# Patient Record
Sex: Female | Born: 1980 | Race: Black or African American | Hispanic: No | Smoking: Never smoker
Health system: Southern US, Community
[De-identification: ages and names within clinical notes are randomized; demographics above are authoritative.]

## PROBLEM LIST (undated history)

## (undated) DIAGNOSIS — D219 Benign neoplasm of connective and other soft tissue, unspecified: Secondary | ICD-10-CM

## (undated) HISTORY — DX: Benign neoplasm of connective and other soft tissue, unspecified: D21.9

---

## 2014-05-04 ENCOUNTER — Emergency Department: Payer: Self-pay | Admitting: Emergency Medicine

## 2015-07-05 ENCOUNTER — Emergency Department: Payer: Medicaid Other

## 2015-07-05 ENCOUNTER — Emergency Department
Admission: EM | Admit: 2015-07-05 | Discharge: 2015-07-05 | Disposition: A | Discharge status: Home / Self Care | Payer: Self-pay | Attending: Emergency Medicine | Admitting: Emergency Medicine

## 2015-07-05 ENCOUNTER — Encounter: Payer: Self-pay | Admitting: Emergency Medicine

## 2015-07-05 DIAGNOSIS — Z3202 Encounter for pregnancy test, result negative: Secondary | ICD-10-CM | POA: Insufficient documentation

## 2015-07-05 DIAGNOSIS — R51 Headache: Secondary | ICD-10-CM

## 2015-07-05 DIAGNOSIS — J0100 Acute maxillary sinusitis, unspecified: Secondary | ICD-10-CM | POA: Insufficient documentation

## 2015-07-05 DIAGNOSIS — R112 Nausea with vomiting, unspecified: Secondary | ICD-10-CM | POA: Insufficient documentation

## 2015-07-05 DIAGNOSIS — R519 Headache, unspecified: Secondary | ICD-10-CM

## 2015-07-05 LAB — POCT PREGNANCY, URINE: PREG TEST UR: NEGATIVE

## 2015-07-05 MED ORDER — KETOROLAC TROMETHAMINE 10 MG PO TABS
10.0000 mg | ORAL_TABLET | Freq: Once | ORAL | Status: DC
  Filled 2015-07-05: qty 1

## 2015-07-05 MED ORDER — ONDANSETRON HCL 4 MG/2ML IJ SOLN
4.0000 mg | Freq: Once | INTRAMUSCULAR | Status: AC
  Administered 2015-07-05: 4 mg via INTRAVENOUS
  Filled 2015-07-05: qty 2

## 2015-07-05 MED ORDER — ONDANSETRON 4 MG PO TBDP
4.0000 mg | ORAL_TABLET | Freq: Once | ORAL | Status: AC | PRN
  Administered 2015-07-05: 4 mg via ORAL

## 2015-07-05 MED ORDER — KETOROLAC TROMETHAMINE 30 MG/ML IJ SOLN
30.0000 mg | Freq: Once | INTRAMUSCULAR | Status: AC
  Administered 2015-07-05: 30 mg via INTRAVENOUS
  Filled 2015-07-05: qty 1

## 2015-07-05 MED ORDER — ONDANSETRON 4 MG PO TBDP
ORAL_TABLET | ORAL | Status: AC
  Filled 2015-07-05: qty 1

## 2015-07-05 MED ORDER — AMOXICILLIN-POT CLAVULANATE 875-125 MG PO TABS: 1.0000 | ORAL_TABLET | Freq: Two times a day (BID) | ORAL | Status: AC

## 2015-07-05 MED ORDER — KETOROLAC TROMETHAMINE 10 MG PO TABS: 10.0000 mg | ORAL_TABLET | Freq: Three times a day (TID) | ORAL | Status: DC | PRN

## 2015-07-05 MED ORDER — KETOROLAC TROMETHAMINE 10 MG PO TABS
ORAL_TABLET | ORAL | Status: AC
  Filled 2015-07-05: qty 1

## 2015-07-05 MED ORDER — DIPHENHYDRAMINE HCL 50 MG/ML IJ SOLN
25.0000 mg | Freq: Once | INTRAMUSCULAR | Status: AC
  Administered 2015-07-05: 25 mg via INTRAVENOUS
  Filled 2015-07-05: qty 1

## 2015-07-05 MED ORDER — ONDANSETRON 4 MG PO TBDP: 4.0000 mg | ORAL_TABLET | Freq: Three times a day (TID) | ORAL | Status: DC | PRN

## 2015-07-05 NOTE — ED Notes (Signed)
Pt states severe headache for the past few days, states hx of migranes when she was younger, states nausea, vomiting, light sensitivity, states she had cold and congestion last month but no trauma or problems since, pt awake and alert

## 2015-07-05 NOTE — ED Provider Notes (Signed)
[facility] Emergency Department Provider Note  ____________________________________________  Time seen: 4:00 PM  I have reviewed the triage vital signs and the nursing notes.   HISTORY  Chief Complaint Headache and Vomiting     HPI Sherry Sanders is a 34 y.o. female presents with generalized headache with onset yesterday afternoon persistently since then. Patient missed a photophobia and nausea and vomiting. Patient states that pain is aggravated with noise and light. Of note the patient admits to a history of migraine headaches in the past was recent of which was last week. In addition patient admits to 3 week history of nasal congestion. Patient admits to nausea however no vomiting.     Past medical history Headaches There are no active problems to display for this patient.   Past Surgical History  Procedure Laterality Date  . Cesarean section  2007    Current Outpatient Rx  Name  Route  Sig  Dispense  Refill  . amoxicillin-clavulanate (AUGMENTIN) 875-125 MG tablet   Oral   Take 1 tablet by mouth 2 (two) times daily.   14 tablet   0   . ketorolac (TORADOL) 10 MG tablet   Oral   Take 1 tablet (10 mg total) by mouth every 8 (eight) hours as needed.   20 tablet   0   . ondansetron (ZOFRAN ODT) 4 MG disintegrating tablet   Oral   Take 1 tablet (4 mg total) by mouth every 8 (eight) hours as needed for nausea or vomiting.   20 tablet   0     Allergies Review of patient's allergies indicates no known allergies.  No family history on file.  Social History Social History  Substance Use Topics  . Smoking status: Never Smoker   . Smokeless tobacco: None  . Alcohol Use: No    Review of Systems  Constitutional: Negative for fever. Eyes: Negative for visual changes. ENT: Negative for sore throat. Cardiovascular: Negative for chest pain. Respiratory: Negative for shortness of breath. Gastrointestinal: Negative for abdominal  pain, vomiting and diarrhea. Genitourinary: Negative for dysuria. Musculoskeletal: Negative for back pain. Skin: Negative for rash. Neurological: Positive for headache  10-point ROS otherwise negative.  ____________________________________________   PHYSICAL EXAM:  VITAL SIGNS: ED Triage Vitals  Enc Vitals Group     BP 07/05/15 1454 119/58 mmHg     Pulse Rate 07/05/15 1454 61     Resp 07/05/15 1454 16     Temp 07/05/15 1454 98.2 F (36.8 C)     Temp Source 07/05/15 1454 Oral     SpO2 07/05/15 1454 100 %     Weight 07/05/15 1454 161 lb (73.029 kg)     Height 07/05/15 1454 4\' 10"  (1.473 m)     Head Cir --      Peak Flow --      Pain Score 07/05/15 1455 10     Pain Loc --      Pain Edu? --      Excl. in [place]? --     Constitutional: Alert and oriented. Apparent discomfort Eyes: Conjunctivae are normal. PERRL. Normal extraocular movements. ENT   Head: Normocephalic and atraumatic.   Nose: No congestion/rhinnorhea. Pain with percussion of maxillary and frontal sinuses.   Mouth/Throat: Mucous membranes are moist.   Neck: No stridor. Hematological/Lymphatic/Immunilogical: No cervical lymphadenopathy. Cardiovascular: Normal rate, regular rhythm. Normal and symmetric distal pulses are present in all extremities. No murmurs, rubs, or gallops. Respiratory: Normal respiratory effort without tachypnea nor retractions.  Breath sounds are clear and equal bilaterally. No wheezes/rales/rhonchi. Gastrointestinal: Soft and nontender. No distention. There is no CVA tenderness. Genitourinary: deferred Musculoskeletal: Nontender with normal range of motion in all extremities. No joint effusions.  No lower extremity tenderness nor edema. Neurologic:  Normal speech and language. No gross focal neurologic deficits are appreciated. Speech is normal.  Skin:  Skin is warm, dry and intact. No rash noted. Psychiatric: Mood and affect are normal. Speech and behavior are normal. Patient  exhibits appropriate insight and judgment.  ____________________________________________    LABS (pertinent positives/negatives)  Labs Reviewed  POCT PREGNANCY, URINE     RADIOLOGY        CT Head Wo Contrast (Final result) Result time: 07/05/15 18:14:33   Final result by Rad Results In Interface (07/05/15 18:14:33)   Narrative:   CLINICAL DATA: Headache and vomiting  EXAM: CT HEAD WITHOUT CONTRAST  TECHNIQUE: Contiguous axial images were obtained from the base of the skull through the vertex without intravenous contrast.  COMPARISON: None.  FINDINGS: Ventricle size is normal. Negative for acute or chronic infarction. Negative for hemorrhage or fluid collection. Negative for mass or edema. No shift of the midline structures.  Calvarium is intact.  IMPRESSION: Normal   Electronically Signed By: Franchot Gallo M.D. On: 07/05/2015 18:14        INITIAL IMPRESSION / ASSESSMENT AND PLAN / ED COURSE  Pertinent labs & imaging results that were available during my care of the patient were reviewed by me and considered in my medical decision making (see chart for details).  Patient received IV Toradol Benadryl and Zofran with resolution of pain  ____________________________________________   FINAL CLINICAL IMPRESSION(S) / ED DIAGNOSES  Final diagnoses:  Acute nonintractable headache, unspecified headache type  Acute maxillary sinusitis, recurrence not specified      Gregor Hams, MD 07/05/15 [phone]

## 2015-07-05 NOTE — ED Notes (Signed)
Patient presents to the ED with headache that started yesterday afternoon.  Patient reports photophobia, nausea, and vomiting.  Patient reports pain to the sides of her head and the top of her head.  Patient reports smells bothering her.  Patient states this is one of the worst headaches she has ever had.  Patient is alert and oriented x 4.  Denies any head trauma.

## 2015-07-05 NOTE — Discharge Instructions (Signed)

## 2016-02-03 DIAGNOSIS — J301 Allergic rhinitis due to pollen: Secondary | ICD-10-CM | POA: Insufficient documentation

## 2017-07-05 ENCOUNTER — Other Ambulatory Visit: Payer: Self-pay

## 2017-07-05 ENCOUNTER — Emergency Department: Payer: Self-pay

## 2017-07-05 ENCOUNTER — Encounter: Payer: Self-pay | Admitting: Emergency Medicine

## 2017-07-05 ENCOUNTER — Emergency Department
Admission: EM | Admit: 2017-07-05 | Discharge: 2017-07-05 | Disposition: A | Discharge status: Home / Self Care | Payer: Self-pay | Attending: Emergency Medicine | Admitting: Emergency Medicine

## 2017-07-05 DIAGNOSIS — R079 Chest pain, unspecified: Secondary | ICD-10-CM | POA: Insufficient documentation

## 2017-07-05 LAB — TROPONIN I
Troponin I: <0.03 ng/mL (ref <0.03)
Troponin I: <0.03 ng/mL (ref <0.03)

## 2017-07-05 LAB — CBC
HEMATOCRIT: 40.8 % (ref 35.0–47.0)
HEMOGLOBIN: 13.9 g/dL (ref 12.0–16.0)
MCH: 30.9 pg (ref 26.0–34.0)
MCHC: 34.2 g/dL (ref 32.0–36.0)
MCV: 90.5 fL (ref 80.0–100.0)
Platelets: 253 10*3/uL (ref 150–440)
RBC: 4.51 MIL/uL (ref 3.80–5.20)
RDW: 13.4 % (ref 11.5–14.5)
WBC: 9 10*3/uL (ref 3.6–11.0)

## 2017-07-05 LAB — BASIC METABOLIC PANEL
ANION GAP: 5 (ref 5–15)
BUN: 13 mg/dL (ref 6–20)
CHLORIDE: 106 mmol/L (ref 101–111)
CO2: 23 mmol/L (ref 22–32)
Calcium: 8.6 mg/dL — ABNORMAL LOW (ref 8.9–10.3)
Creatinine, Ser: 0.85 mg/dL (ref 0.44–1.00)
GFR calc Af Amer: >60 mL/min (ref >60)
GLUCOSE: 87 mg/dL (ref 65–99)
POTASSIUM: 3.7 mmol/L (ref 3.5–5.1)
Sodium: 134 mmol/L — ABNORMAL LOW (ref 135–145)

## 2017-07-05 NOTE — ED Notes (Signed)
Pt to ed with c/o chest pain that started while she was walking up steps.  Pt states she felt weak, dizzy, and nearly passed out. Pt states she continues to feel weak, but denies chest pain at this time.  States chest pain lasted about 3 minutes and then resolved.  Pt with family at bedside. In no acute resp distress.  Pt alert and oriented, skin warm and dry.

## 2017-07-05 NOTE — ED Provider Notes (Signed)
[facility] Emergency Department Provider Note   ____________________________________________    I have reviewed the triage vital signs and the nursing notes.   HISTORY  Chief Complaint Chest Pain     HPI Sherry Sanders is a 36 y.o. female who presents with complaints of chest pain.  Patient reports she was walking up the stairs at approximately 11 AM this morning when she developed a tightness in her chest and she became quite lightheaded.  The pain did not radiate anywhere.  She has never had this before.  She reports she did not eat any breakfast this morning.  She reports EMS came and gave her aspirin and nitroglycerin at which point the tightness improved.  Currently she feels well and has no complaints.  No history of cardiac disease.  She does not smoke.  No history of high blood pressure.  No drug use.  No recent travel.  No calf pain or pleurisy.   History reviewed. No pertinent past medical history.  There are no active problems to display for this patient.   Past Surgical History:  Procedure Laterality Date  . CESAREAN SECTION  2007    Prior to Admission medications   Medication Sig Start Date End Date Taking? Authorizing Provider  ketorolac (TORADOL) 10 MG tablet Take 1 tablet (10 mg total) by mouth every 8 (eight) hours as needed. 07/05/15   Gregor Hams, MD  ondansetron (ZOFRAN ODT) 4 MG disintegrating tablet Take 1 tablet (4 mg total) by mouth every 8 (eight) hours as needed for nausea or vomiting. 07/05/15   Gregor Hams, MD     Allergies Patient has no known allergies.  No family history on file.  Social History Social History   Tobacco Use  . Smoking status: Never Smoker  Substance Use Topics  . Alcohol use: No  . Drug use: No    Review of Systems  Constitutional: No fever/chills Eyes: No visual changes.  ENT: No neck pain Cardiovascular: As above Respiratory: Denies shortness of breath.  No  cough Gastrointestinal: No abdominal pain.  No nausea, no vomiting.   Genitourinary: Negative for dysuria. Musculoskeletal: Negative for back pain. Skin: Negative for rash. Neurological: Negative for headaches   ____________________________________________   PHYSICAL EXAM:  VITAL SIGNS: ED Triage Vitals  Enc Vitals Group     BP 07/05/17 1229 97/67     Pulse Rate 07/05/17 1229 73     Resp 07/05/17 1229 16     Temp 07/05/17 1229 98.3 F (36.8 C)     Temp Source 07/05/17 1229 Oral     SpO2 07/05/17 1229 99 %     Weight 07/05/17 1230 73 kg (161 lb)     Height 07/05/17 1230 1.473 m (4\' 10" )     Head Circumference --      Peak Flow --      Pain Score 07/05/17 1229 5     Pain Loc --      Pain Edu? --      Excl. in [place]? --     Constitutional: Alert and oriented. No acute distress. Pleasant and interactive  Nose: No congestion/rhinnorhea. Mouth/Throat: Mucous membranes are moist.    Cardiovascular: Normal rate, regular rhythm. Grossly normal heart sounds.  Good peripheral circulation. Respiratory: Normal respiratory effort.  No retractions. Lungs CTAB. Gastrointestinal: Soft and nontender. No distention.  No CVA tenderness. Genitourinary: deferred Musculoskeletal: No lower extremity tenderness nor edema.  Warm and well perfused Neurologic:  Normal  speech and language. No gross focal neurologic deficits are appreciated.  Skin:  Skin is warm, dry and intact. No rash noted. Psychiatric: Mood and affect are normal. Speech and behavior are normal.  ____________________________________________   LABS (all labs ordered are listed, but only abnormal results are displayed)  Labs Reviewed  BASIC METABOLIC PANEL - Abnormal; Notable for the following components:      Result Value   Sodium 134 (*)    Calcium 8.6 (*)    All other components within normal limits  CBC  TROPONIN I  TROPONIN I   ____________________________________________  EKG  ED ECG REPORT I, Lavonia Drafts,  the attending physician, personally viewed and interpreted this ECG.  Date: 07/05/2017  Rhythm: normal sinus rhythm QRS Axis: normal Intervals: normal ST/T Wave abnormalities: normal Narrative Interpretation: no evidence of acute ischemia  ____________________________________________  RADIOLOGY  Chest x-ray normal ____________________________________________   PROCEDURES  Procedure(s) performed: No  Procedures   Critical Care performed:No ____________________________________________   INITIAL IMPRESSION / ASSESSMENT AND PLAN / ED COURSE  Pertinent labs & imaging results that were available during my care of the patient were reviewed by me and considered in my medical decision making (see chart for details).  She well-appearing in no acute distress.  Vitals are unremarkable.  EKG is reassuring.  Differential diagnosis includes vasovagal syncope, near syncope, ACS, arrhythmia, PE  Initial lab work and exam is quite reassuring.  However we will check second troponin to determine disposition given no risk factors for cardiac disease anticipate discharge  Second troponin normal.  Patient remains chest pain-free.  Vitals remain normal.  Reinforced need for outpatient follow-up with cardiology, patient and family agree.  No exercise or exertion until cleared by cardiology    ____________________________________________   FINAL CLINICAL IMPRESSION(S) / ED DIAGNOSES  Final diagnoses:  Chest pain, unspecified type        Note:  This document was prepared using Dragon voice recognition software and may include unintentional dictation errors.    Lavonia Drafts, MD 07/05/17 [phone]

## 2017-07-05 NOTE — ED Triage Notes (Signed)
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 Denies any SOB. She states that she felt like she was going to pass out and slid her self down the railings of the steps.

## 2017-07-05 NOTE — ED Notes (Signed)
First nurse note:  Patient arrived by EMS from work where she had a sudden onset of chest pain, dizziness, and nausea.  H/o vertigo and heart murmur.  Patient given aspirin 325, 1 nitro, and 4 zofran in route.

## 2017-07-18 DIAGNOSIS — R55 Syncope and collapse: Secondary | ICD-10-CM | POA: Insufficient documentation

## 2017-07-18 DIAGNOSIS — R072 Precordial pain: Secondary | ICD-10-CM | POA: Insufficient documentation

## 2018-11-29 ENCOUNTER — Other Ambulatory Visit: Payer: Self-pay | Admitting: Family

## 2018-11-29 DIAGNOSIS — Z1231 Encounter for screening mammogram for malignant neoplasm of breast: Secondary | ICD-10-CM

## 2019-01-23 ENCOUNTER — Ambulatory Visit
Admission: RE | Admit: 2019-01-23 | Discharge: 2019-01-23 | Disposition: A | Discharge status: Home / Self Care | Payer: BLUE CROSS/BLUE SHIELD | Source: Ambulatory Visit | Attending: Family | Admitting: Family

## 2019-01-23 ENCOUNTER — Other Ambulatory Visit: Payer: Self-pay

## 2019-01-23 DIAGNOSIS — Z1231 Encounter for screening mammogram for malignant neoplasm of breast: Secondary | ICD-10-CM | POA: Diagnosis not present

## 2019-01-27 ENCOUNTER — Other Ambulatory Visit: Payer: Self-pay | Admitting: Family

## 2019-01-27 DIAGNOSIS — R928 Other abnormal and inconclusive findings on diagnostic imaging of breast: Secondary | ICD-10-CM

## 2019-01-27 DIAGNOSIS — N6320 Unspecified lump in the left breast, unspecified quadrant: Secondary | ICD-10-CM

## 2019-01-27 DIAGNOSIS — N6310 Unspecified lump in the right breast, unspecified quadrant: Secondary | ICD-10-CM

## 2019-02-07 ENCOUNTER — Ambulatory Visit
Admission: RE | Admit: 2019-02-07 | Discharge: 2019-02-07 | Disposition: A | Discharge status: Home / Self Care | Payer: BLUE CROSS/BLUE SHIELD | Source: Ambulatory Visit | Attending: Family | Admitting: Family

## 2019-02-07 DIAGNOSIS — R928 Other abnormal and inconclusive findings on diagnostic imaging of breast: Secondary | ICD-10-CM

## 2019-02-07 DIAGNOSIS — N6310 Unspecified lump in the right breast, unspecified quadrant: Secondary | ICD-10-CM

## 2019-02-07 DIAGNOSIS — N6320 Unspecified lump in the left breast, unspecified quadrant: Secondary | ICD-10-CM

## 2020-01-06 ENCOUNTER — Other Ambulatory Visit: Payer: Self-pay | Admitting: Internal Medicine

## 2020-01-06 DIAGNOSIS — Z1231 Encounter for screening mammogram for malignant neoplasm of breast: Secondary | ICD-10-CM

## 2020-02-11 ENCOUNTER — Ambulatory Visit
Admission: RE | Admit: 2020-02-11 | Discharge: 2020-02-11 | Disposition: A | Discharge status: Home / Self Care | Payer: BLUE CROSS/BLUE SHIELD | Source: Ambulatory Visit | Attending: Internal Medicine | Admitting: Internal Medicine

## 2020-02-11 ENCOUNTER — Other Ambulatory Visit: Payer: Self-pay

## 2020-02-11 DIAGNOSIS — Z1231 Encounter for screening mammogram for malignant neoplasm of breast: Secondary | ICD-10-CM | POA: Insufficient documentation

## 2020-07-06 ENCOUNTER — Other Ambulatory Visit: Payer: Self-pay | Admitting: Internal Medicine

## 2020-07-06 DIAGNOSIS — R1900 Intra-abdominal and pelvic swelling, mass and lump, unspecified site: Secondary | ICD-10-CM

## 2020-07-08 ENCOUNTER — Other Ambulatory Visit: Payer: Self-pay | Admitting: Internal Medicine

## 2020-07-08 DIAGNOSIS — D259 Leiomyoma of uterus, unspecified: Secondary | ICD-10-CM

## 2020-07-14 ENCOUNTER — Other Ambulatory Visit: Payer: Self-pay

## 2020-07-14 ENCOUNTER — Ambulatory Visit
Admission: RE | Admit: 2020-07-14 | Discharge: 2020-07-14 | Disposition: A | Discharge status: Home / Self Care | Payer: 59 | Source: Ambulatory Visit | Attending: Internal Medicine | Admitting: Internal Medicine

## 2020-07-14 DIAGNOSIS — D259 Leiomyoma of uterus, unspecified: Secondary | ICD-10-CM | POA: Insufficient documentation

## 2020-07-21 ENCOUNTER — Telehealth: Payer: Self-pay

## 2020-07-21 NOTE — Telephone Encounter (Signed)
[facility] referring for large fibroids. Paper records . Called and left voicemail for patient to call back to be scheduled.

## 2020-08-18 ENCOUNTER — Encounter: Payer: Self-pay | Admitting: Obstetrics and Gynecology

## 2020-08-18 ENCOUNTER — Encounter: Payer: Self-pay | Admitting: Obstetrics & Gynecology

## 2020-08-18 ENCOUNTER — Other Ambulatory Visit: Payer: Self-pay

## 2020-08-18 ENCOUNTER — Ambulatory Visit (INDEPENDENT_AMBULATORY_CARE_PROVIDER_SITE_OTHER): Payer: 59 | Admitting: Obstetrics and Gynecology

## 2020-08-18 VITALS — BP 114/72 | Ht <= 58 in | Wt 159.0 lb

## 2020-08-18 DIAGNOSIS — D251 Intramural leiomyoma of uterus: Secondary | ICD-10-CM

## 2020-08-18 DIAGNOSIS — D250 Submucous leiomyoma of uterus: Secondary | ICD-10-CM

## 2020-08-18 NOTE — Progress Notes (Signed)
Obstetrics & Gynecology Office Visit   Chief Complaint  Patient presents with  . Referral    Fibroid referral   The patient is seen in referral at the request of Bufford Spikes, MD from [facility] for uterine fibroid.   History of Present Illness: 40 y.o. G19P1001 female who is seen in referral from Lamonte Sakai , MD from [facility] for uterine fibroid.  She had an ultrasound due to findings on a bimanual exam that showed an irregular contour of her uterus. The ultrasound showed a 10 cm fibroid (with possible submucosal location). She has no symptoms. She has irregular periods with norethindrone.  Pelvic ultrasound report (07/14/2020) Uterus: 11.9 x 10.1 x 11. 8 cm Enlarged by central mass at mid to upper uterus 10.4 x 9.2 x 9.4 cm, appears intramural but might extend into the submucosal area. Endometrium: not well visualized.  Right ovary: not visualized Left ovary: 2.2 x 2.2 x 2.4 cm on transabdominal imaging only.   No free fluid in the pelvis  Last pap smear 07/06/2020: NILM, HPV negative  Past Medical History:  Diagnosis Date  . Fibroid     Past Surgical History:  Procedure Laterality Date  . CESAREAN SECTION  2007    Gynecologic History: Patient's last menstrual period was 07/18/2020.  Obstetric History: G1P1001  Family History  Problem Relation Age of Onset  . Breast cancer Neg Hx     Social History   Socioeconomic History  . Marital status: Married    Spouse name: Not on file  . Number of children: Not on file  . Years of education: Not on file  . Highest education level: Not on file  Occupational History  . Not on file  Tobacco Use  . Smoking status: Never Smoker  . Smokeless tobacco: Never Used  Substance and Sexual Activity  . Alcohol use: No  . Drug use: No  . Sexual activity: Yes    Birth control/protection: Pill  Other Topics Concern  . Not on file  Social History Narrative  . Not on file   Social  Determinants of Health   Financial Resource Strain: Not on file  Food Insecurity: Not on file  Transportation Needs: Not on file  Physical Activity: Not on file  Stress: Not on file  Social Connections: Not on file  Intimate Partner Violence: Not on file    No Known Allergies  Prior to Admission medications   Medication Sig Start Date End Date Taking? Authorizing Provider  ketorolac (TORADOL) 10 MG tablet Take 1 tablet (10 mg total) by mouth every 8 (eight) hours as needed. 07/05/15   Gregor Hams, MD  Loratadine 10 MG CAPS Take by mouth.    [provider]  norethindrone (MICRONOR) 0.35 MG tablet Take 1 tablet by mouth daily. 06/14/20   [provider]  ondansetron (ZOFRAN ODT) 4 MG disintegrating tablet Take 1 tablet (4 mg total) by mouth every 8 (eight) hours as needed for nausea or vomiting. 07/05/15   Gregor Hams, MD    Review of Systems  Constitutional: Negative.   HENT: Negative.   Eyes: Negative.   Respiratory: Negative.   Cardiovascular: Negative.   Gastrointestinal: Negative.   Genitourinary: Negative.   Musculoskeletal: Negative.   Skin: Negative.   Neurological: Negative.   Psychiatric/Behavioral: Negative.      Physical Exam BP 114/72   Ht 4\' 10"  (1.473 m)   Wt 159 lb (72.1 kg)   LMP 07/18/2020  BMI 33.23 kg/m  Patient's last menstrual period was 07/18/2020. Physical Exam Constitutional:      General: She is not in acute distress.    Appearance: Normal appearance.  HENT:     Head: Normocephalic and atraumatic.  Eyes:     General: No scleral icterus.    Conjunctiva/sclera: Conjunctivae normal.  Neurological:     General: No focal deficit present.     Mental Status: She is alert and oriented to person, place, and time.     Cranial Nerves: No cranial nerve deficit.  Psychiatric:        Mood and Affect: Mood normal.        Behavior: Behavior normal.        Judgment: Judgment normal.     Female chaperone present for  pelvic and breast  portions of the physical exam  Assessment: 40 y.o. G64P1001 female here for  1. Intramural and submucous leiomyoma of uterus      Plan: Problem List Items Addressed This Visit   None   Visit Diagnoses    Intramural and submucous leiomyoma of uterus    -  Primary   Relevant Orders   US PELVIS TRANSVAGINAL NON-OB (TV ONLY)   US PELVIS (TRANSABDOMINAL ONLY)     Discussed management of her fibroid.  Discussed observation at this time, treatment with medication or remove medication that might feed fibroid (like progesterone), procedures like uterine fibroid embolization, myomectomy (open or laparoscopic), hysterectomy. After discussing the relative advantages and disadvantages of each, she would like to continue to monitor for now since she has no symptoms.  Will have her get an ultrasound in 6-12 months or sooner, as indicated.   All questions answered.  A total of 30 minutes were spent face-to-face with the patient as well as preparation, review, communication, and documentation during this encounter.    Prentice Docker, MD 08/18/2020 4:07 PM    CC: Lamonte Sakai, MD [address] [place],  [place] [address]

## 2020-10-14 IMAGING — MG DIGITAL SCREENING BILATERAL MAMMOGRAM WITH TOMO AND CAD
8 series · 8 of 24 positions shown · non-contrast
Comparison: None.

CLINICAL DATA: Screening.

EXAM:
DIGITAL SCREENING BILATERAL MAMMOGRAM WITH TOMO AND CAD

[L MLO synth-2D]
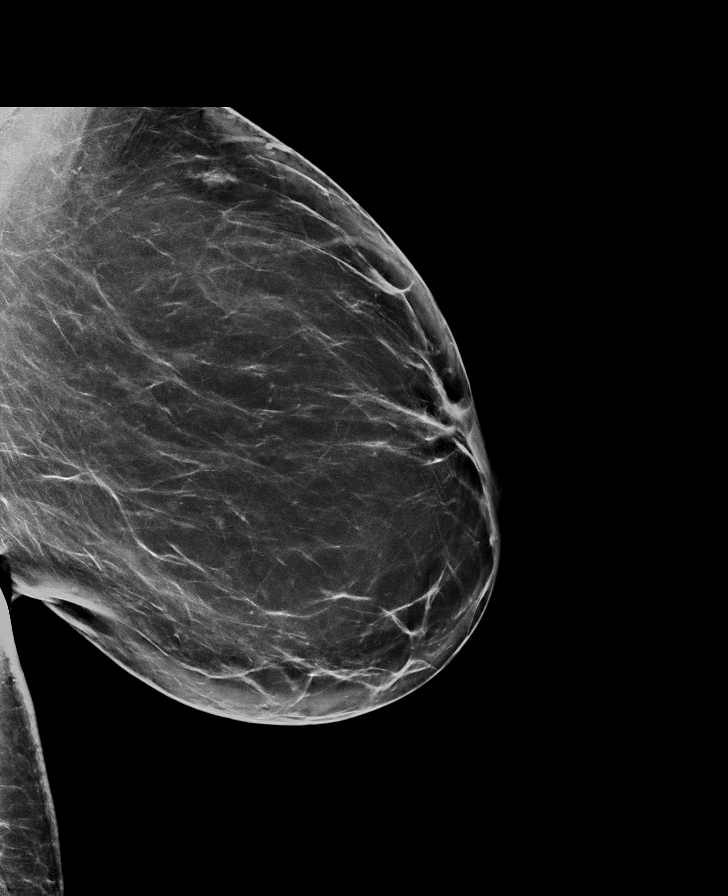

[R CC synth-2D]
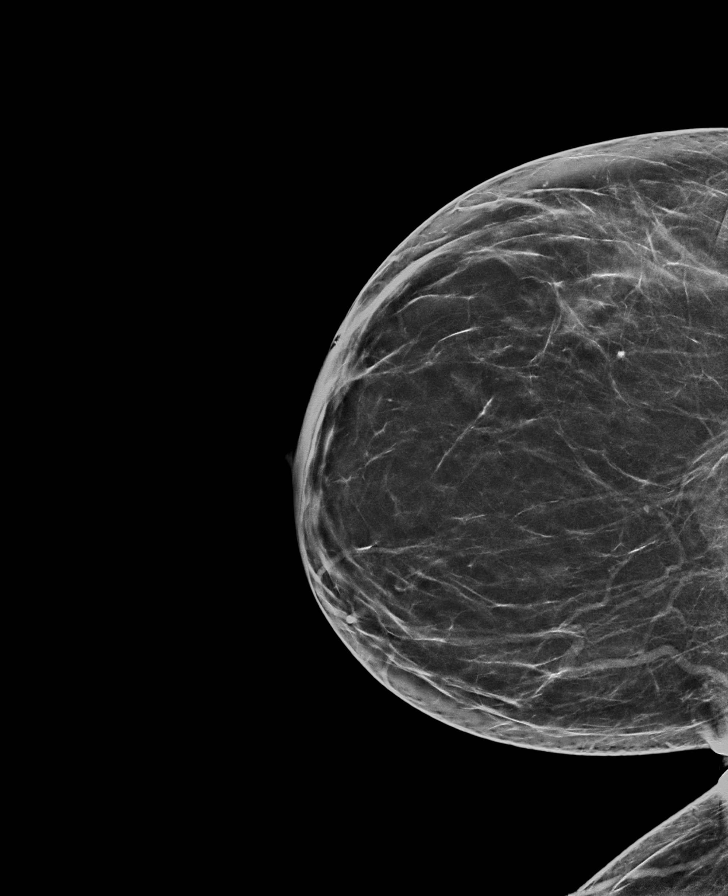

[L CC synth-2D]
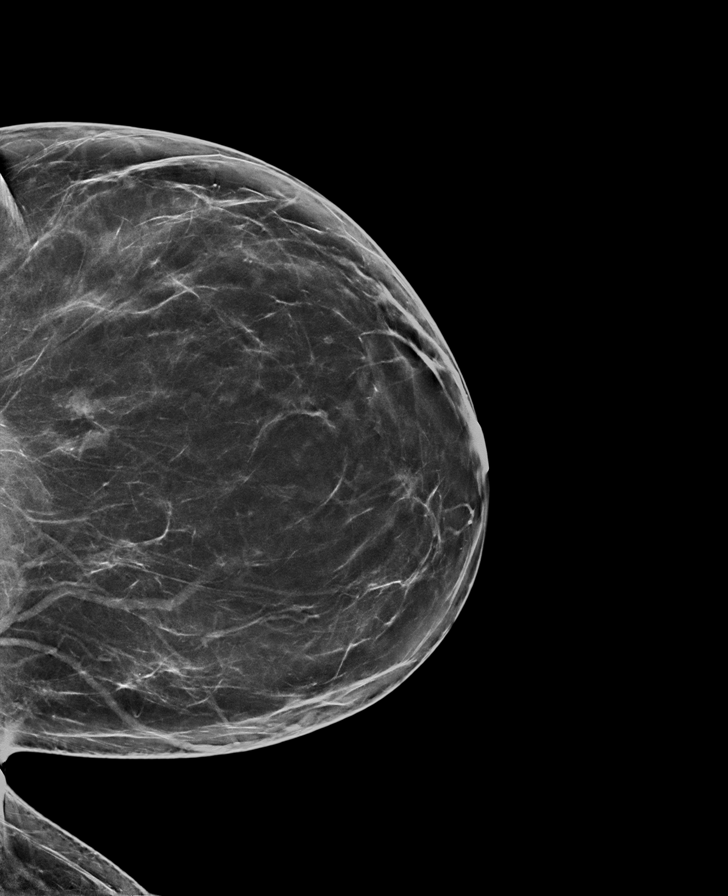

[R MLO synth-2D]
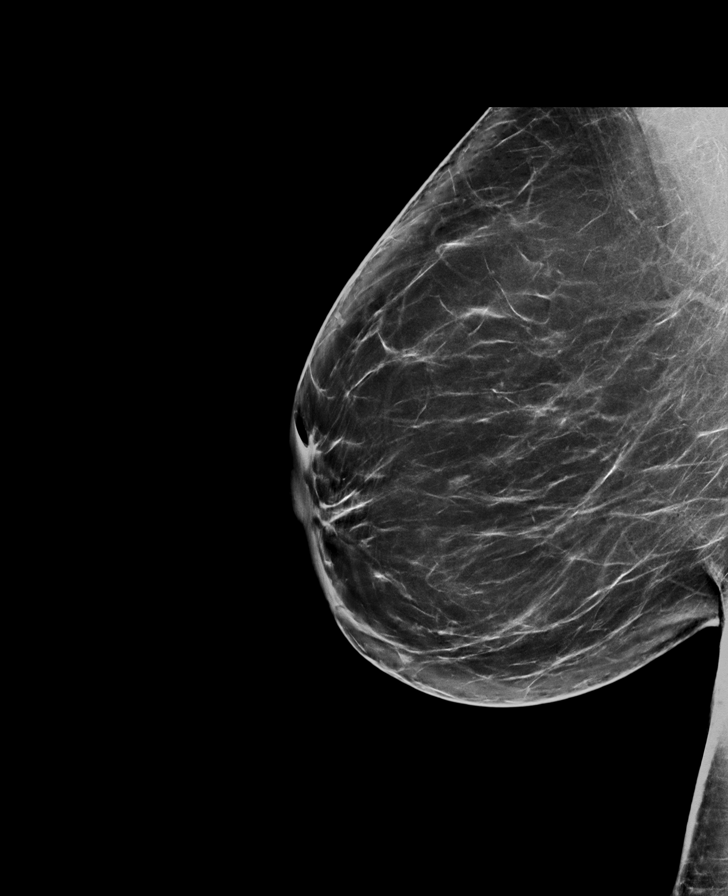

[L CC tomo · tomo slice 41/80.0]
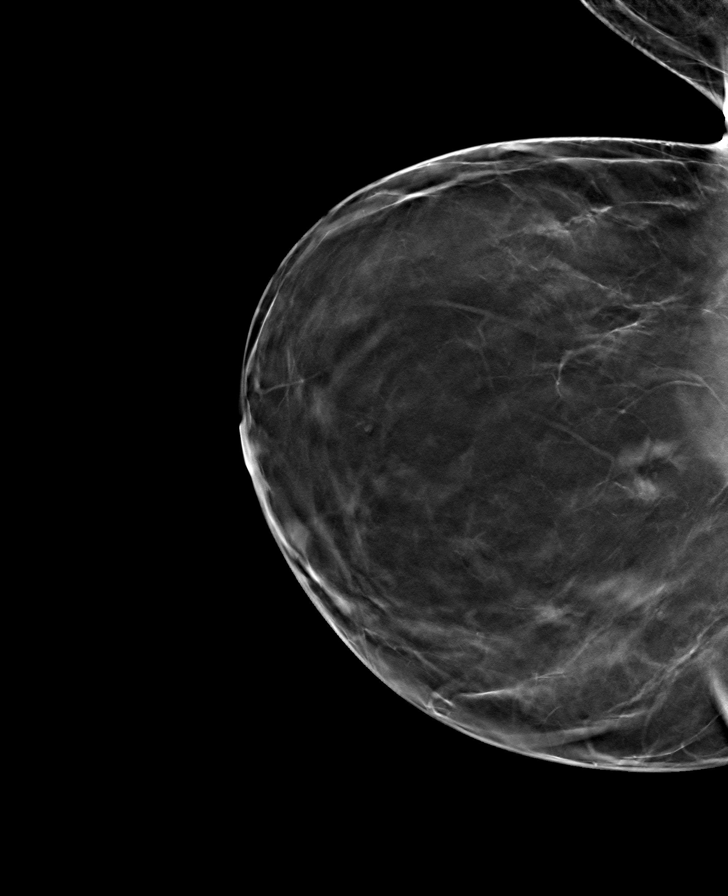

[R CC tomo · tomo slice 38/75.0]
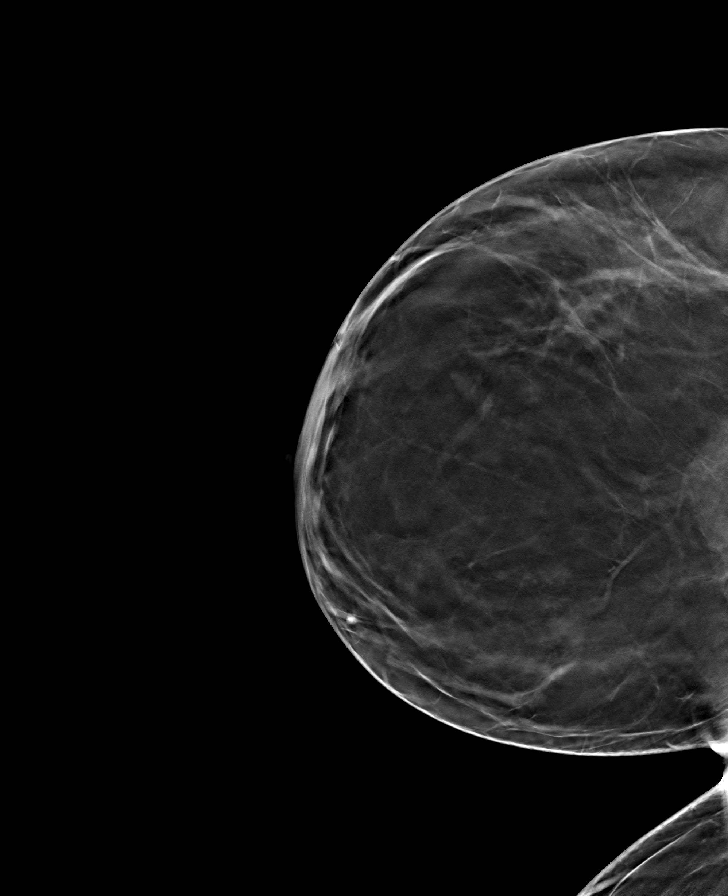

[L MLO tomo · tomo slice 46/91.0]
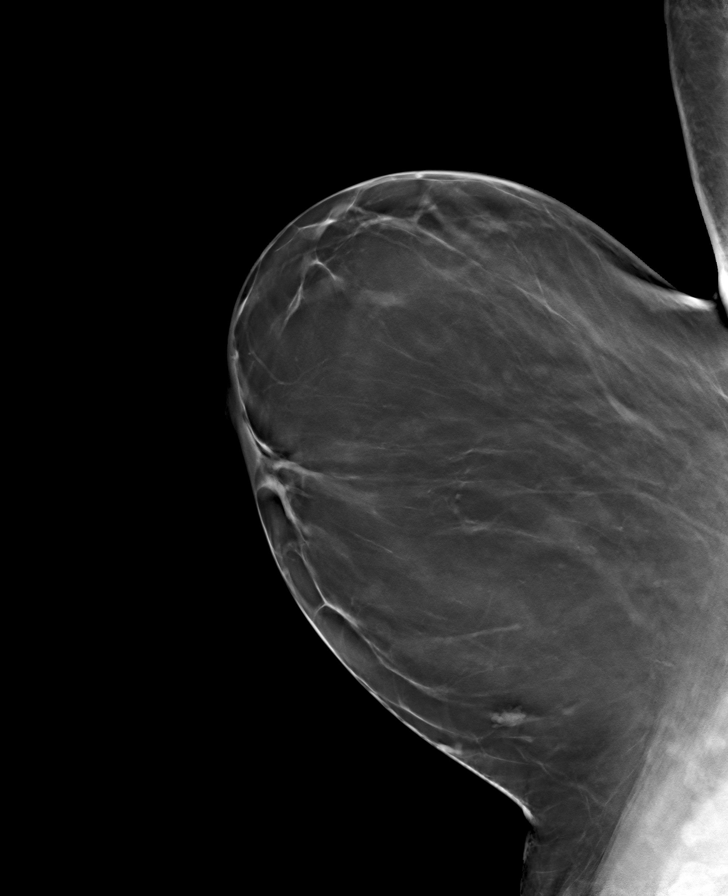

[R MLO tomo · tomo slice 45/90.0]
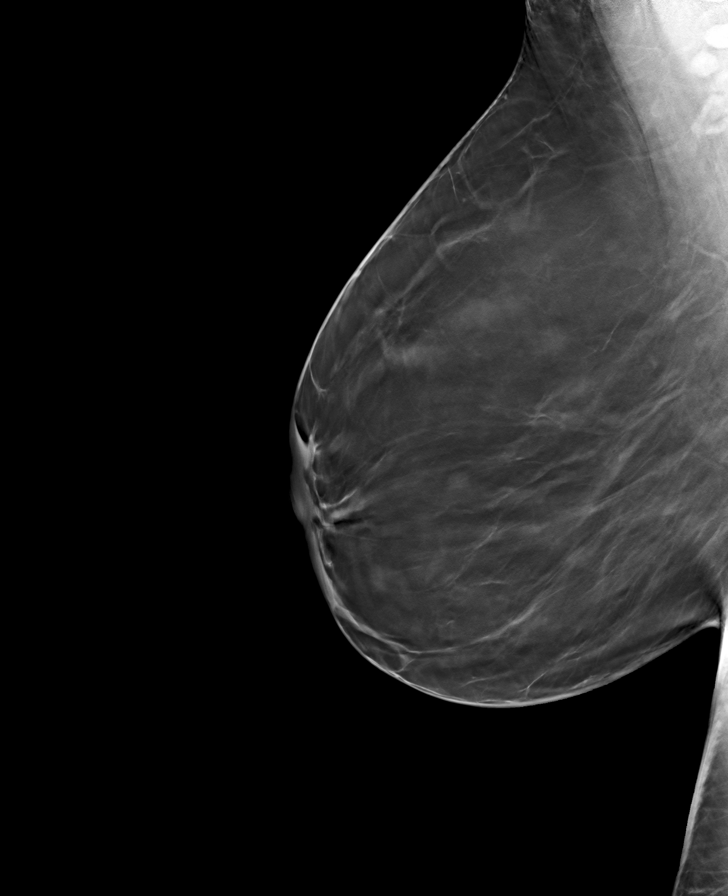

[8 of 24 positions shown; findings below may reference images not displayed]

ACR Breast Density Category b: There are scattered areas of
fibroglandular density.
FINDINGS: In the right breast , a possible mass requires further evaluation.

In the left breast , a possible mass requires further evaluation.

Images were processed with CAD.
IMPRESSION: Further evaluation is suggested for possible mass in the right
breast.

Further evaluation is suggested for possible mass in the left
breast.

RECOMMENDATION:
Diagnostic mammogram and possibly ultrasound of both breasts.
(Code:[id number]-[id number])

The patient will be contacted regarding the findings, and additional
imaging will be scheduled.

BI-RADS CATEGORY  0: Incomplete. Need additional imaging evaluation
and/or prior mammograms for comparison.

## 2021-01-25 NOTE — Addendum Note (Signed)
Addended by: Prentice Docker D on: 01/25/2021 07:55 AM   Modules accepted: Orders

## 2021-02-07 ENCOUNTER — Ambulatory Visit: Payer: 59

## 2021-02-07 ENCOUNTER — Ambulatory Visit
Admission: RE | Admit: 2021-02-07 | Discharge: 2021-02-07 | Disposition: A | Discharge status: Home / Self Care | Payer: 59 | Source: Ambulatory Visit | Attending: Obstetrics and Gynecology | Admitting: Obstetrics and Gynecology

## 2021-02-07 ENCOUNTER — Other Ambulatory Visit: Payer: Self-pay

## 2021-02-07 DIAGNOSIS — D251 Intramural leiomyoma of uterus: Secondary | ICD-10-CM | POA: Insufficient documentation

## 2021-02-07 DIAGNOSIS — D250 Submucous leiomyoma of uterus: Secondary | ICD-10-CM | POA: Insufficient documentation

## 2021-02-08 ENCOUNTER — Encounter: Payer: Self-pay | Admitting: Obstetrics and Gynecology

## 2021-02-08 ENCOUNTER — Ambulatory Visit (INDEPENDENT_AMBULATORY_CARE_PROVIDER_SITE_OTHER): Payer: 59 | Admitting: Obstetrics and Gynecology

## 2021-02-08 VITALS — Ht 58.5 in | Wt 157.0 lb

## 2021-02-08 DIAGNOSIS — D251 Intramural leiomyoma of uterus: Secondary | ICD-10-CM | POA: Diagnosis not present

## 2021-02-08 DIAGNOSIS — D250 Submucous leiomyoma of uterus: Secondary | ICD-10-CM | POA: Diagnosis not present

## 2021-02-08 NOTE — Progress Notes (Signed)
Virtual Visit via Telephone Note  I connected with Sherry Sanders on 02/08/21 at  4:30 PM EDT by telephone and verified that I am speaking with the correct person using two identifiers.   I discussed the limitations, risks, security and privacy concerns of performing an evaluation and management service by telephone and the availability of in person appointments. I also discussed with the patient that there may be a patient responsible charge related to this service. The patient expressed understanding and agreed to proceed.  The patient was at work I spoke with the patient from my  office The names of people involved in this encounter were: Karie Rena Rau and Prentice Docker, MD.   History of Present Illness: 40 y.o. G32P1001 female who presents in follow up from an enlarged fibroid, noted six months ago. The previous report noted a 10 cm fibroid.  She continues to have no symptoms. She takes norethindrone for birth control. Her periods are a little irregular with this medication.  She is not passing clots.   On her ultrasound in January 2022, her ultrasound report showed the following: Uterus: 11.9 x 10.1 x 11.8 cm Mass (fibroid): 10.4 x 9.2 x 9.4 cm Endometrium could not be seen.  Right ovary: not visualized Left ovary: 2.2 x 2.2 x 2.4 cm   The report from the ultrasound performed yesterday has not yet been created. See below for my interpretation of the ultrasound based on my looking at all the images available at this time.   Observations/Objective: Physical Exam could not be performed. Because of the COVID-19 outbreak this visit was performed over the phone and not in person.   Ultrasound 02/08/2021 (unofficial read, my read only):  Uterus: (TA) 12.3 x 8.2 x 8.6 (TV) 11.6 x 8.23 x 9 cm  Mass (TA) 7.3 x7 x 6.2 cm Mass (TV) 8.1 x 7.3 x 7.76 cm  Em Stripe: 7.1 mm  ROV (TA): 3.45 x 1.56 x 1.75 cm ROV (TV): 2.1 x 1.46  x 2.6 cm  LOV 4.76 x 2.25 x 2.8 cm (CL  cyst)  CVX: Nabothian cyst  Assessment and Plan:   ICD-10-CM   1. Intramural and submucous leiomyoma of uterus  D25.1    D25.0        Follow Up Instructions: Follow up u/s 6 months.   The fibroid appears somewhat smaller on this scan. She has no symptoms. Discussed repeating the ultrasound in 6 months or 1 year. She opted to have another ultrasound in 6 months. If stable at that point, may stop watching unless she develops symptoms.    I discussed the assessment and treatment plan with the patient. The patient was provided an opportunity to ask questions and all were answered. The patient agreed with the plan and demonstrated an understanding of the instructions.   The patient was advised to call back or seek an in-person evaluation if the symptoms worsen or if the condition fails to improve as anticipated.  I provided 22 minutes of non-face-to-face time during this encounter.  Prentice Docker, MD  [facility] 02/08/2021 4:33 PM  CC: Lamonte Sakai, MD [address] [place],  [place] [address]

## 2021-02-09 ENCOUNTER — Ambulatory Visit: Payer: 59 | Admitting: Obstetrics and Gynecology

## 2021-02-09 ENCOUNTER — Ambulatory Visit: Payer: 59

## 2021-02-09 DIAGNOSIS — D250 Submucous leiomyoma of uterus: Secondary | ICD-10-CM

## 2021-02-21 ENCOUNTER — Other Ambulatory Visit: Payer: 59

## 2021-06-15 ENCOUNTER — Ambulatory Visit: Payer: 59

## 2021-08-15 ENCOUNTER — Ambulatory Visit
Admission: RE | Admit: 2021-08-15 | Discharge: 2021-08-15 | Disposition: A | Discharge status: Home / Self Care | Payer: 59 | Source: Ambulatory Visit | Attending: Obstetrics and Gynecology | Admitting: Obstetrics and Gynecology

## 2021-08-15 ENCOUNTER — Other Ambulatory Visit: Payer: Self-pay

## 2021-08-15 DIAGNOSIS — D251 Intramural leiomyoma of uterus: Secondary | ICD-10-CM | POA: Insufficient documentation

## 2021-08-15 DIAGNOSIS — D250 Submucous leiomyoma of uterus: Secondary | ICD-10-CM | POA: Diagnosis present

## 2021-08-29 DIAGNOSIS — E782 Mixed hyperlipidemia: Secondary | ICD-10-CM | POA: Diagnosis not present

## 2021-08-29 DIAGNOSIS — J3089 Other allergic rhinitis: Secondary | ICD-10-CM | POA: Diagnosis not present

## 2021-08-29 DIAGNOSIS — E669 Obesity, unspecified: Secondary | ICD-10-CM | POA: Diagnosis not present

## 2021-08-29 DIAGNOSIS — E559 Vitamin D deficiency, unspecified: Secondary | ICD-10-CM | POA: Diagnosis not present

## 2021-08-31 DIAGNOSIS — E559 Vitamin D deficiency, unspecified: Secondary | ICD-10-CM | POA: Diagnosis not present

## 2021-08-31 DIAGNOSIS — E669 Obesity, unspecified: Secondary | ICD-10-CM | POA: Diagnosis not present

## 2021-08-31 DIAGNOSIS — E782 Mixed hyperlipidemia: Secondary | ICD-10-CM | POA: Diagnosis not present

## 2021-11-28 DIAGNOSIS — R7303 Prediabetes: Secondary | ICD-10-CM | POA: Diagnosis not present

## 2021-11-28 DIAGNOSIS — J301 Allergic rhinitis due to pollen: Secondary | ICD-10-CM | POA: Diagnosis not present

## 2021-11-28 DIAGNOSIS — I10 Essential (primary) hypertension: Secondary | ICD-10-CM | POA: Diagnosis not present

## 2021-11-28 DIAGNOSIS — E559 Vitamin D deficiency, unspecified: Secondary | ICD-10-CM | POA: Diagnosis not present

## 2021-11-28 DIAGNOSIS — E782 Mixed hyperlipidemia: Secondary | ICD-10-CM | POA: Diagnosis not present

## 2021-11-28 DIAGNOSIS — E669 Obesity, unspecified: Secondary | ICD-10-CM | POA: Diagnosis not present

## 2022-01-03 DIAGNOSIS — M9901 Segmental and somatic dysfunction of cervical region: Secondary | ICD-10-CM | POA: Diagnosis not present

## 2022-01-03 DIAGNOSIS — M9902 Segmental and somatic dysfunction of thoracic region: Secondary | ICD-10-CM | POA: Diagnosis not present

## 2022-01-03 DIAGNOSIS — R519 Headache, unspecified: Secondary | ICD-10-CM | POA: Diagnosis not present

## 2022-01-03 DIAGNOSIS — M5412 Radiculopathy, cervical region: Secondary | ICD-10-CM | POA: Diagnosis not present

## 2022-01-12 DIAGNOSIS — J302 Other seasonal allergic rhinitis: Secondary | ICD-10-CM | POA: Diagnosis not present

## 2022-01-17 DIAGNOSIS — M9902 Segmental and somatic dysfunction of thoracic region: Secondary | ICD-10-CM | POA: Diagnosis not present

## 2022-01-17 DIAGNOSIS — M9901 Segmental and somatic dysfunction of cervical region: Secondary | ICD-10-CM | POA: Diagnosis not present

## 2022-01-17 DIAGNOSIS — M5412 Radiculopathy, cervical region: Secondary | ICD-10-CM | POA: Diagnosis not present

## 2022-01-17 DIAGNOSIS — R519 Headache, unspecified: Secondary | ICD-10-CM | POA: Diagnosis not present

## 2022-01-20 DIAGNOSIS — J301 Allergic rhinitis due to pollen: Secondary | ICD-10-CM | POA: Diagnosis not present

## 2022-01-31 DIAGNOSIS — M9901 Segmental and somatic dysfunction of cervical region: Secondary | ICD-10-CM | POA: Diagnosis not present

## 2022-01-31 DIAGNOSIS — M9902 Segmental and somatic dysfunction of thoracic region: Secondary | ICD-10-CM | POA: Diagnosis not present

## 2022-01-31 DIAGNOSIS — R519 Headache, unspecified: Secondary | ICD-10-CM | POA: Diagnosis not present

## 2022-01-31 DIAGNOSIS — M5412 Radiculopathy, cervical region: Secondary | ICD-10-CM | POA: Diagnosis not present

## 2022-02-02 DIAGNOSIS — J302 Other seasonal allergic rhinitis: Secondary | ICD-10-CM | POA: Diagnosis not present

## 2022-02-14 DIAGNOSIS — M9902 Segmental and somatic dysfunction of thoracic region: Secondary | ICD-10-CM | POA: Diagnosis not present

## 2022-02-14 DIAGNOSIS — M5412 Radiculopathy, cervical region: Secondary | ICD-10-CM | POA: Diagnosis not present

## 2022-02-14 DIAGNOSIS — M9901 Segmental and somatic dysfunction of cervical region: Secondary | ICD-10-CM | POA: Diagnosis not present

## 2022-02-14 DIAGNOSIS — R519 Headache, unspecified: Secondary | ICD-10-CM | POA: Diagnosis not present

## 2022-03-01 DIAGNOSIS — M5412 Radiculopathy, cervical region: Secondary | ICD-10-CM | POA: Diagnosis not present

## 2022-03-01 DIAGNOSIS — R519 Headache, unspecified: Secondary | ICD-10-CM | POA: Diagnosis not present

## 2022-03-01 DIAGNOSIS — M9902 Segmental and somatic dysfunction of thoracic region: Secondary | ICD-10-CM | POA: Diagnosis not present

## 2022-03-01 DIAGNOSIS — M9901 Segmental and somatic dysfunction of cervical region: Secondary | ICD-10-CM | POA: Diagnosis not present

## 2022-03-15 DIAGNOSIS — M9902 Segmental and somatic dysfunction of thoracic region: Secondary | ICD-10-CM | POA: Diagnosis not present

## 2022-03-15 DIAGNOSIS — R519 Headache, unspecified: Secondary | ICD-10-CM | POA: Diagnosis not present

## 2022-03-15 DIAGNOSIS — M9901 Segmental and somatic dysfunction of cervical region: Secondary | ICD-10-CM | POA: Diagnosis not present

## 2022-03-15 DIAGNOSIS — M5412 Radiculopathy, cervical region: Secondary | ICD-10-CM | POA: Diagnosis not present

## 2022-03-30 DIAGNOSIS — M9901 Segmental and somatic dysfunction of cervical region: Secondary | ICD-10-CM | POA: Diagnosis not present

## 2022-03-30 DIAGNOSIS — M5412 Radiculopathy, cervical region: Secondary | ICD-10-CM | POA: Diagnosis not present

## 2022-03-30 DIAGNOSIS — M9902 Segmental and somatic dysfunction of thoracic region: Secondary | ICD-10-CM | POA: Diagnosis not present

## 2022-03-30 DIAGNOSIS — R519 Headache, unspecified: Secondary | ICD-10-CM | POA: Diagnosis not present

## 2022-03-31 DIAGNOSIS — J3089 Other allergic rhinitis: Secondary | ICD-10-CM | POA: Diagnosis not present

## 2022-03-31 DIAGNOSIS — E669 Obesity, unspecified: Secondary | ICD-10-CM | POA: Diagnosis not present

## 2022-03-31 DIAGNOSIS — E559 Vitamin D deficiency, unspecified: Secondary | ICD-10-CM | POA: Diagnosis not present

## 2022-03-31 DIAGNOSIS — T753XXA Motion sickness, initial encounter: Secondary | ICD-10-CM | POA: Diagnosis not present

## 2022-03-31 DIAGNOSIS — I10 Essential (primary) hypertension: Secondary | ICD-10-CM | POA: Diagnosis not present

## 2022-03-31 DIAGNOSIS — R7303 Prediabetes: Secondary | ICD-10-CM | POA: Diagnosis not present

## 2022-03-31 DIAGNOSIS — E782 Mixed hyperlipidemia: Secondary | ICD-10-CM | POA: Diagnosis not present

## 2022-04-12 DIAGNOSIS — M5412 Radiculopathy, cervical region: Secondary | ICD-10-CM | POA: Diagnosis not present

## 2022-04-12 DIAGNOSIS — M9901 Segmental and somatic dysfunction of cervical region: Secondary | ICD-10-CM | POA: Diagnosis not present

## 2022-04-12 DIAGNOSIS — M9902 Segmental and somatic dysfunction of thoracic region: Secondary | ICD-10-CM | POA: Diagnosis not present

## 2022-04-12 DIAGNOSIS — R519 Headache, unspecified: Secondary | ICD-10-CM | POA: Diagnosis not present

## 2022-04-19 DIAGNOSIS — M5412 Radiculopathy, cervical region: Secondary | ICD-10-CM | POA: Diagnosis not present

## 2022-04-19 DIAGNOSIS — R519 Headache, unspecified: Secondary | ICD-10-CM | POA: Diagnosis not present

## 2022-04-19 DIAGNOSIS — M9901 Segmental and somatic dysfunction of cervical region: Secondary | ICD-10-CM | POA: Diagnosis not present

## 2022-04-19 DIAGNOSIS — M9902 Segmental and somatic dysfunction of thoracic region: Secondary | ICD-10-CM | POA: Diagnosis not present

## 2022-05-04 DIAGNOSIS — R519 Headache, unspecified: Secondary | ICD-10-CM | POA: Diagnosis not present

## 2022-05-04 DIAGNOSIS — M9901 Segmental and somatic dysfunction of cervical region: Secondary | ICD-10-CM | POA: Diagnosis not present

## 2022-05-04 DIAGNOSIS — M9902 Segmental and somatic dysfunction of thoracic region: Secondary | ICD-10-CM | POA: Diagnosis not present

## 2022-05-04 DIAGNOSIS — M5412 Radiculopathy, cervical region: Secondary | ICD-10-CM | POA: Diagnosis not present

## 2022-05-09 DIAGNOSIS — K219 Gastro-esophageal reflux disease without esophagitis: Secondary | ICD-10-CM | POA: Diagnosis not present

## 2022-05-09 DIAGNOSIS — J302 Other seasonal allergic rhinitis: Secondary | ICD-10-CM | POA: Diagnosis not present

## 2022-05-18 DIAGNOSIS — M9902 Segmental and somatic dysfunction of thoracic region: Secondary | ICD-10-CM | POA: Diagnosis not present

## 2022-05-18 DIAGNOSIS — M9901 Segmental and somatic dysfunction of cervical region: Secondary | ICD-10-CM | POA: Diagnosis not present

## 2022-05-18 DIAGNOSIS — R519 Headache, unspecified: Secondary | ICD-10-CM | POA: Diagnosis not present

## 2022-05-18 DIAGNOSIS — M5412 Radiculopathy, cervical region: Secondary | ICD-10-CM | POA: Diagnosis not present

## 2022-05-31 DIAGNOSIS — M9902 Segmental and somatic dysfunction of thoracic region: Secondary | ICD-10-CM | POA: Diagnosis not present

## 2022-05-31 DIAGNOSIS — M5412 Radiculopathy, cervical region: Secondary | ICD-10-CM | POA: Diagnosis not present

## 2022-05-31 DIAGNOSIS — M9901 Segmental and somatic dysfunction of cervical region: Secondary | ICD-10-CM | POA: Diagnosis not present

## 2022-05-31 DIAGNOSIS — R519 Headache, unspecified: Secondary | ICD-10-CM | POA: Diagnosis not present

## 2022-06-14 DIAGNOSIS — M5412 Radiculopathy, cervical region: Secondary | ICD-10-CM | POA: Diagnosis not present

## 2022-06-14 DIAGNOSIS — M9902 Segmental and somatic dysfunction of thoracic region: Secondary | ICD-10-CM | POA: Diagnosis not present

## 2022-06-14 DIAGNOSIS — R519 Headache, unspecified: Secondary | ICD-10-CM | POA: Diagnosis not present

## 2022-06-14 DIAGNOSIS — M9901 Segmental and somatic dysfunction of cervical region: Secondary | ICD-10-CM | POA: Diagnosis not present

## 2022-06-28 DIAGNOSIS — M9901 Segmental and somatic dysfunction of cervical region: Secondary | ICD-10-CM | POA: Diagnosis not present

## 2022-06-28 DIAGNOSIS — R519 Headache, unspecified: Secondary | ICD-10-CM | POA: Diagnosis not present

## 2022-06-28 DIAGNOSIS — M5412 Radiculopathy, cervical region: Secondary | ICD-10-CM | POA: Diagnosis not present

## 2022-06-28 DIAGNOSIS — M9902 Segmental and somatic dysfunction of thoracic region: Secondary | ICD-10-CM | POA: Diagnosis not present

## 2022-07-13 DIAGNOSIS — M5412 Radiculopathy, cervical region: Secondary | ICD-10-CM | POA: Diagnosis not present

## 2022-07-13 DIAGNOSIS — R519 Headache, unspecified: Secondary | ICD-10-CM | POA: Diagnosis not present

## 2022-07-13 DIAGNOSIS — M9902 Segmental and somatic dysfunction of thoracic region: Secondary | ICD-10-CM | POA: Diagnosis not present

## 2022-07-13 DIAGNOSIS — M9901 Segmental and somatic dysfunction of cervical region: Secondary | ICD-10-CM | POA: Diagnosis not present

## 2022-07-13 DIAGNOSIS — H04123 Dry eye syndrome of bilateral lacrimal glands: Secondary | ICD-10-CM | POA: Diagnosis not present

## 2022-07-25 DIAGNOSIS — M5412 Radiculopathy, cervical region: Secondary | ICD-10-CM | POA: Diagnosis not present

## 2022-07-25 DIAGNOSIS — M9902 Segmental and somatic dysfunction of thoracic region: Secondary | ICD-10-CM | POA: Diagnosis not present

## 2022-07-25 DIAGNOSIS — R519 Headache, unspecified: Secondary | ICD-10-CM | POA: Diagnosis not present

## 2022-07-25 DIAGNOSIS — M9901 Segmental and somatic dysfunction of cervical region: Secondary | ICD-10-CM | POA: Diagnosis not present

## 2022-07-31 DIAGNOSIS — R7303 Prediabetes: Secondary | ICD-10-CM | POA: Diagnosis not present

## 2022-07-31 DIAGNOSIS — R69 Illness, unspecified: Secondary | ICD-10-CM | POA: Diagnosis not present

## 2022-07-31 DIAGNOSIS — E559 Vitamin D deficiency, unspecified: Secondary | ICD-10-CM | POA: Diagnosis not present

## 2022-07-31 DIAGNOSIS — E663 Overweight: Secondary | ICD-10-CM | POA: Diagnosis not present

## 2022-07-31 DIAGNOSIS — I10 Essential (primary) hypertension: Secondary | ICD-10-CM | POA: Diagnosis not present

## 2022-07-31 DIAGNOSIS — E782 Mixed hyperlipidemia: Secondary | ICD-10-CM | POA: Diagnosis not present

## 2022-08-09 DIAGNOSIS — R519 Headache, unspecified: Secondary | ICD-10-CM | POA: Diagnosis not present

## 2022-08-09 DIAGNOSIS — M5412 Radiculopathy, cervical region: Secondary | ICD-10-CM | POA: Diagnosis not present

## 2022-08-09 DIAGNOSIS — M9901 Segmental and somatic dysfunction of cervical region: Secondary | ICD-10-CM | POA: Diagnosis not present

## 2022-08-09 DIAGNOSIS — M9902 Segmental and somatic dysfunction of thoracic region: Secondary | ICD-10-CM | POA: Diagnosis not present

## 2022-08-16 ENCOUNTER — Other Ambulatory Visit: Payer: Self-pay | Admitting: Family

## 2022-08-16 ENCOUNTER — Other Ambulatory Visit: Payer: Self-pay

## 2022-08-16 MED ORDER — MOUNJARO 2.5 MG/0.5ML ~~LOC~~ SOAJ: 2.5000 mg | SUBCUTANEOUS | 1 refills | Status: DC

## 2022-08-20 ENCOUNTER — Other Ambulatory Visit: Payer: Self-pay | Admitting: Family

## 2022-08-23 DIAGNOSIS — M9901 Segmental and somatic dysfunction of cervical region: Secondary | ICD-10-CM | POA: Diagnosis not present

## 2022-08-23 DIAGNOSIS — R519 Headache, unspecified: Secondary | ICD-10-CM | POA: Diagnosis not present

## 2022-08-23 DIAGNOSIS — M5412 Radiculopathy, cervical region: Secondary | ICD-10-CM | POA: Diagnosis not present

## 2022-08-23 DIAGNOSIS — M9902 Segmental and somatic dysfunction of thoracic region: Secondary | ICD-10-CM | POA: Diagnosis not present

## 2022-09-06 DIAGNOSIS — R519 Headache, unspecified: Secondary | ICD-10-CM | POA: Diagnosis not present

## 2022-09-06 DIAGNOSIS — M9902 Segmental and somatic dysfunction of thoracic region: Secondary | ICD-10-CM | POA: Diagnosis not present

## 2022-09-06 DIAGNOSIS — M9901 Segmental and somatic dysfunction of cervical region: Secondary | ICD-10-CM | POA: Diagnosis not present

## 2022-09-06 DIAGNOSIS — M5412 Radiculopathy, cervical region: Secondary | ICD-10-CM | POA: Diagnosis not present

## 2022-09-13 ENCOUNTER — Telehealth: Payer: Self-pay

## 2022-09-13 NOTE — Telephone Encounter (Signed)
Patient LM asking for call back but did not state what the call was in regards too

## 2022-09-18 ENCOUNTER — Other Ambulatory Visit: Payer: Self-pay | Admitting: Family

## 2022-09-18 MED ORDER — TRULICITY 0.75 MG/0.5ML ~~LOC~~ SOAJ: 0.7500 mg | SUBCUTANEOUS | 0 refills | Status: DC

## 2022-09-20 DIAGNOSIS — M5412 Radiculopathy, cervical region: Secondary | ICD-10-CM | POA: Diagnosis not present

## 2022-09-20 DIAGNOSIS — M9901 Segmental and somatic dysfunction of cervical region: Secondary | ICD-10-CM | POA: Diagnosis not present

## 2022-09-20 DIAGNOSIS — R519 Headache, unspecified: Secondary | ICD-10-CM | POA: Diagnosis not present

## 2022-09-20 DIAGNOSIS — M9902 Segmental and somatic dysfunction of thoracic region: Secondary | ICD-10-CM | POA: Diagnosis not present

## 2022-09-29 DIAGNOSIS — L814 Other melanin hyperpigmentation: Secondary | ICD-10-CM | POA: Diagnosis not present

## 2022-10-04 DIAGNOSIS — R519 Headache, unspecified: Secondary | ICD-10-CM | POA: Diagnosis not present

## 2022-10-04 DIAGNOSIS — M5412 Radiculopathy, cervical region: Secondary | ICD-10-CM | POA: Diagnosis not present

## 2022-10-04 DIAGNOSIS — M9902 Segmental and somatic dysfunction of thoracic region: Secondary | ICD-10-CM | POA: Diagnosis not present

## 2022-10-04 DIAGNOSIS — M9901 Segmental and somatic dysfunction of cervical region: Secondary | ICD-10-CM | POA: Diagnosis not present

## 2022-10-15 ENCOUNTER — Other Ambulatory Visit: Payer: Self-pay | Admitting: Family

## 2022-10-18 DIAGNOSIS — M9901 Segmental and somatic dysfunction of cervical region: Secondary | ICD-10-CM | POA: Diagnosis not present

## 2022-10-18 DIAGNOSIS — M5412 Radiculopathy, cervical region: Secondary | ICD-10-CM | POA: Diagnosis not present

## 2022-10-18 DIAGNOSIS — M9902 Segmental and somatic dysfunction of thoracic region: Secondary | ICD-10-CM | POA: Diagnosis not present

## 2022-10-18 DIAGNOSIS — R519 Headache, unspecified: Secondary | ICD-10-CM | POA: Diagnosis not present

## 2022-10-18 DIAGNOSIS — H0288B Meibomian gland dysfunction left eye, upper and lower eyelids: Secondary | ICD-10-CM | POA: Diagnosis not present

## 2022-10-18 DIAGNOSIS — H0288A Meibomian gland dysfunction right eye, upper and lower eyelids: Secondary | ICD-10-CM | POA: Diagnosis not present

## 2022-10-26 ENCOUNTER — Other Ambulatory Visit: Payer: Self-pay | Admitting: Family

## 2022-10-26 MED ORDER — PHENTERMINE HCL 37.5 MG PO TABS: 37.5000 mg | ORAL_TABLET | Freq: Every day | ORAL | 0 refills | Status: DC

## 2022-11-01 DIAGNOSIS — M9901 Segmental and somatic dysfunction of cervical region: Secondary | ICD-10-CM | POA: Diagnosis not present

## 2022-11-01 DIAGNOSIS — M5412 Radiculopathy, cervical region: Secondary | ICD-10-CM | POA: Diagnosis not present

## 2022-11-01 DIAGNOSIS — R519 Headache, unspecified: Secondary | ICD-10-CM | POA: Diagnosis not present

## 2022-11-01 DIAGNOSIS — M9902 Segmental and somatic dysfunction of thoracic region: Secondary | ICD-10-CM | POA: Diagnosis not present

## 2022-11-15 DIAGNOSIS — M9901 Segmental and somatic dysfunction of cervical region: Secondary | ICD-10-CM | POA: Diagnosis not present

## 2022-11-15 DIAGNOSIS — M5412 Radiculopathy, cervical region: Secondary | ICD-10-CM | POA: Diagnosis not present

## 2022-11-15 DIAGNOSIS — M9902 Segmental and somatic dysfunction of thoracic region: Secondary | ICD-10-CM | POA: Diagnosis not present

## 2022-11-15 DIAGNOSIS — R519 Headache, unspecified: Secondary | ICD-10-CM | POA: Diagnosis not present

## 2022-11-22 DIAGNOSIS — H04123 Dry eye syndrome of bilateral lacrimal glands: Secondary | ICD-10-CM | POA: Diagnosis not present

## 2022-11-29 DIAGNOSIS — M5412 Radiculopathy, cervical region: Secondary | ICD-10-CM | POA: Diagnosis not present

## 2022-11-29 DIAGNOSIS — M9902 Segmental and somatic dysfunction of thoracic region: Secondary | ICD-10-CM | POA: Diagnosis not present

## 2022-11-29 DIAGNOSIS — M9901 Segmental and somatic dysfunction of cervical region: Secondary | ICD-10-CM | POA: Diagnosis not present

## 2022-11-29 DIAGNOSIS — R519 Headache, unspecified: Secondary | ICD-10-CM | POA: Diagnosis not present

## 2022-11-30 ENCOUNTER — Encounter: Payer: Self-pay | Admitting: Family

## 2022-11-30 ENCOUNTER — Ambulatory Visit (INDEPENDENT_AMBULATORY_CARE_PROVIDER_SITE_OTHER): Payer: 59 | Admitting: Family

## 2022-11-30 VITALS — BP 120/78 | HR 96 | Ht <= 58 in | Wt 154.0 lb

## 2022-11-30 DIAGNOSIS — E559 Vitamin D deficiency, unspecified: Secondary | ICD-10-CM

## 2022-11-30 DIAGNOSIS — R5383 Other fatigue: Secondary | ICD-10-CM

## 2022-11-30 DIAGNOSIS — E538 Deficiency of other specified B group vitamins: Secondary | ICD-10-CM

## 2022-11-30 DIAGNOSIS — R7303 Prediabetes: Secondary | ICD-10-CM

## 2022-11-30 DIAGNOSIS — E782 Mixed hyperlipidemia: Secondary | ICD-10-CM | POA: Diagnosis not present

## 2022-12-01 ENCOUNTER — Encounter: Payer: Self-pay | Admitting: Family

## 2022-12-01 LAB — CMP14+EGFR
ALT: 12 IU/L (ref 0–32)
AST: 18 IU/L (ref 0–40)
Albumin/Globulin Ratio: 1.8 (ref 1.2–2.2)
Albumin: 4.4 g/dL (ref 3.9–4.9)
Alkaline Phosphatase: 62 IU/L (ref 44–121)
BUN/Creatinine Ratio: 11 (ref 9–23)
BUN: 11 mg/dL (ref 6–24)
Bilirubin Total: 0.3 mg/dL (ref 0.0–1.2)
CO2: 25 mmol/L (ref 20–29)
Calcium: 9.1 mg/dL (ref 8.7–10.2)
Chloride: 100 mmol/L (ref 96–106)
Creatinine, Ser: 0.98 mg/dL (ref 0.57–1.00)
Globulin, Total: 2.4 g/dL (ref 1.5–4.5)
Glucose: 73 mg/dL (ref 70–99)
Potassium: 4.1 mmol/L (ref 3.5–5.2)
Sodium: 137 mmol/L (ref 134–144)
Total Protein: 6.8 g/dL (ref 6.0–8.5)
eGFR: 73 mL/min/{1.73_m2} (ref >59)

## 2022-12-01 LAB — CBC WITH DIFFERENTIAL
Basophils Absolute: 0 10*3/uL (ref 0.0–0.2)
Basos: 1 %
EOS (ABSOLUTE): 0.1 10*3/uL (ref 0.0–0.4)
Eos: 2 %
Hematocrit: 42.9 % (ref 34.0–46.6)
Hemoglobin: 14.4 g/dL (ref 11.1–15.9)
Immature Grans (Abs): 0 10*3/uL (ref 0.0–0.1)
Immature Granulocytes: 0 %
Lymphocytes Absolute: 2 10*3/uL (ref 0.7–3.1)
Lymphs: 30 %
MCH: 30.5 pg (ref 26.6–33.0)
MCHC: 33.6 g/dL (ref 31.5–35.7)
MCV: 91 fL (ref 79–97)
Monocytes Absolute: 0.4 10*3/uL (ref 0.1–0.9)
Monocytes: 7 %
Neutrophils Absolute: 4.1 10*3/uL (ref 1.4–7.0)
Neutrophils: 60 %
RBC: 4.72 x10E6/uL (ref 3.77–5.28)
RDW: 12.7 % (ref 11.7–15.4)
WBC: 6.6 10*3/uL (ref 3.4–10.8)

## 2022-12-01 LAB — LIPID PANEL
Chol/HDL Ratio: 2.6 ratio (ref 0.0–4.4)
Cholesterol, Total: 120 mg/dL (ref 100–199)
HDL: 47 mg/dL (ref >39)
LDL Chol Calc (NIH): 62 mg/dL (ref 0–99)
Triglycerides: 48 mg/dL (ref 0–149)
VLDL Cholesterol Cal: 11 mg/dL (ref 5–40)

## 2022-12-01 LAB — VITAMIN B12: Vitamin B-12: 1952 pg/mL — ABNORMAL HIGH (ref 232–1245)

## 2022-12-01 LAB — VITAMIN D 25 HYDROXY (VIT D DEFICIENCY, FRACTURES): Vit D, 25-Hydroxy: 66 ng/mL (ref 30.0–100.0)

## 2022-12-01 LAB — TSH: TSH: 1.51 u[IU]/mL (ref 0.450–4.500)

## 2022-12-01 LAB — HEMOGLOBIN A1C
Est. average glucose Bld gHb Est-mCnc: 117 mg/dL
Hgb A1c MFr Bld: 5.7 % — ABNORMAL HIGH (ref 4.8–5.6)

## 2022-12-01 NOTE — Progress Notes (Signed)
Established Patient Office Visit  Subjective:  Patient ID: Sherry Sanders, female    DOB: 1981-01-29  Age: 42 y.o. MRN: [id number]  Chief Complaint  Patient presents with   Follow-up    4 month follow up    Patient is here today for her 3 months follow up.  She has been feeling well since last appointment.   She does not have additional concerns to discuss today.  Labs are due today. She needs refills.   I have reviewed her active problem list, medication list, allergies, notes from last encounter, lab results for her appointment today.   No other concerns at this time.   Past Medical History:  Diagnosis Date   Fibroid     Past Surgical History:  Procedure Laterality Date   CESAREAN SECTION  2007    Social History   Socioeconomic History   Marital status: Significant Other    Spouse name: Not on file   Number of children: Not on file   Years of education: Not on file   Highest education level: Not on file  Occupational History   Not on file  Tobacco Use   Smoking status: Never   Smokeless tobacco: Never  Substance and Sexual Activity   Alcohol use: No   Drug use: No   Sexual activity: Yes    Birth control/protection: Pill  Other Topics Concern   Not on file  Social History Narrative   Not on file   Social Determinants of Health   Financial Resource Strain: Not on file  Food Insecurity: Not on file  Transportation Needs: Not on file  Physical Activity: Not on file  Stress: Not on file  Social Connections: Not on file  Intimate Partner Violence: Not on file    Family History  Problem Relation Age of Onset   Breast cancer Neg Hx     No Known Allergies  Review of Systems  All other systems reviewed and are negative.      Objective:   BP 120/78   Pulse 96   Ht 4\' 10"  (1.473 m)   Wt 154 lb (69.9 kg)   SpO2 97%   BMI 32.19 kg/m   Vitals:   11/30/22 0952  BP: 120/78  Pulse: 96  Height: 4\' 10"  (1.473 m)  Weight: 154 lb (69.9  kg)  SpO2: 97%  BMI (Calculated): 32.19    Physical Exam Vitals and nursing note reviewed.  Constitutional:      Appearance: Normal appearance. She is obese.  HENT:     Head: Normocephalic.  Eyes:     Extraocular Movements: Extraocular movements intact.     Conjunctiva/sclera: Conjunctivae normal.     Pupils: Pupils are equal, round, and reactive to light.  Cardiovascular:     Rate and Rhythm: Normal rate.  Pulmonary:     Effort: Pulmonary effort is normal.  Neurological:     General: No focal deficit present.     Mental Status: She is alert and oriented to person, place, and time. Mental status is at baseline.  Psychiatric:        Mood and Affect: Mood normal.        Behavior: Behavior normal.        Thought Content: Thought content normal.        Judgment: Judgment normal.      Results for orders placed or performed in visit on 11/30/22  Lipid panel  Result Value Ref Range   Cholesterol, Total 120 100 -  199 mg/dL   Triglycerides 48 0 - 149 mg/dL   HDL 47 >45 mg/dL   VLDL Cholesterol Cal 11 5 - 40 mg/dL   LDL Chol Calc (NIH) 62 0 - 99 mg/dL   Chol/HDL Ratio 2.6 0.0 - 4.4 ratio  VITAMIN D 25 Hydroxy (Vit-D Deficiency, Fractures)  Result Value Ref Range   Vit D, 25-Hydroxy 66.0 30.0 - 100.0 ng/mL  CBC With Differential  Result Value Ref Range   WBC 6.6 3.4 - 10.8 x10E3/uL   RBC 4.72 3.77 - 5.28 x10E6/uL   Hemoglobin 14.4 11.1 - 15.9 g/dL   Hematocrit 40.9 81.1 - 46.6 %   MCV 91 79 - 97 fL   MCH 30.5 26.6 - 33.0 pg   MCHC 33.6 31.5 - 35.7 g/dL   RDW 91.4 78.2 - 95.6 %   Neutrophils 60 Not Estab. %   Lymphs 30 Not Estab. %   Monocytes 7 Not Estab. %   Eos 2 Not Estab. %   Basos 1 Not Estab. %   Neutrophils Absolute 4.1 1.4 - 7.0 x10E3/uL   Lymphocytes Absolute 2.0 0.7 - 3.1 x10E3/uL   Monocytes Absolute 0.4 0.1 - 0.9 x10E3/uL   EOS (ABSOLUTE) 0.1 0.0 - 0.4 x10E3/uL   Basophils Absolute 0.0 0.0 - 0.2 x10E3/uL   Immature Granulocytes 0 Not Estab. %    Immature Grans (Abs) 0.0 0.0 - 0.1 x10E3/uL  CMP14+EGFR  Result Value Ref Range   Glucose 73 70 - 99 mg/dL   BUN 11 6 - 24 mg/dL   Creatinine, Ser 2.13 0.57 - 1.00 mg/dL   eGFR 73 >08 MV/HQI/6.96   BUN/Creatinine Ratio 11 9 - 23   Sodium 137 134 - 144 mmol/L   Potassium 4.1 3.5 - 5.2 mmol/L   Chloride 100 96 - 106 mmol/L   CO2 25 20 - 29 mmol/L   Calcium 9.1 8.7 - 10.2 mg/dL   Total Protein 6.8 6.0 - 8.5 g/dL   Albumin 4.4 3.9 - 4.9 g/dL   Globulin, Total 2.4 1.5 - 4.5 g/dL   Albumin/Globulin Ratio 1.8 1.2 - 2.2   Bilirubin Total 0.3 0.0 - 1.2 mg/dL   Alkaline Phosphatase 62 44 - 121 IU/L   AST 18 0 - 40 IU/L   ALT 12 0 - 32 IU/L  TSH  Result Value Ref Range   TSH 1.510 0.450 - 4.500 uIU/mL  Hemoglobin A1c  Result Value Ref Range   Hgb A1c MFr Bld 5.7 (H) 4.8 - 5.6 %   Est. average glucose Bld gHb Est-mCnc 117 mg/dL  Vitamin E95  Result Value Ref Range   Vitamin B-12 1,952 (H) 232 - 1,245 pg/mL    Recent Results (from the past 2160 hour(s))  Lipid panel     Status: None   Collection Time: 11/30/22 10:23 AM  Result Value Ref Range   Cholesterol, Total 120 100 - 199 mg/dL   Triglycerides 48 0 - 149 mg/dL   HDL 47 >28 mg/dL   VLDL Cholesterol Cal 11 5 - 40 mg/dL   LDL Chol Calc (NIH) 62 0 - 99 mg/dL   Chol/HDL Ratio 2.6 0.0 - 4.4 ratio    Comment:                                   T. Chol/HDL Ratio  Men  Women                               1/2 Avg.Risk  3.4    3.3                                   Avg.Risk  5.0    4.4                                2X Avg.Risk  9.6    7.1                                3X Avg.Risk 23.4   11.0   VITAMIN D 25 Hydroxy (Vit-D Deficiency, Fractures)     Status: None   Collection Time: 11/30/22 10:23 AM  Result Value Ref Range   Vit D, 25-Hydroxy 66.0 30.0 - 100.0 ng/mL    Comment: Vitamin D deficiency has been defined by the Institute of Medicine and an Endocrine Society practice guideline as  a level of serum 25-OH vitamin D less than 20 ng/mL (1,2). The Endocrine Society went on to further define vitamin D insufficiency as a level between 21 and 29 ng/mL (2). 1. IOM (Institute of Medicine). 2010. Dietary reference    intakes for calcium and D. [place]: The    [employer]. 2. Holick MF, Binkley Elias-Fela Solis, Bischoff-Ferrari HA, et al.    Evaluation, treatment, and prevention of vitamin D    deficiency: an Endocrine Society clinical practice    guideline. JCEM. 2011 Jul; 96(7):1911-30.   CBC With Differential     Status: None   Collection Time: 11/30/22 10:23 AM  Result Value Ref Range   WBC 6.6 3.4 - 10.8 x10E3/uL   RBC 4.72 3.77 - 5.28 x10E6/uL   Hemoglobin 14.4 11.1 - 15.9 g/dL   Hematocrit 16.1 09.6 - 46.6 %   MCV 91 79 - 97 fL   MCH 30.5 26.6 - 33.0 pg   MCHC 33.6 31.5 - 35.7 g/dL   RDW 04.5 40.9 - 81.1 %   Neutrophils 60 Not Estab. %   Lymphs 30 Not Estab. %   Monocytes 7 Not Estab. %   Eos 2 Not Estab. %   Basos 1 Not Estab. %   Neutrophils Absolute 4.1 1.4 - 7.0 x10E3/uL   Lymphocytes Absolute 2.0 0.7 - 3.1 x10E3/uL   Monocytes Absolute 0.4 0.1 - 0.9 x10E3/uL   EOS (ABSOLUTE) 0.1 0.0 - 0.4 x10E3/uL   Basophils Absolute 0.0 0.0 - 0.2 x10E3/uL   Immature Granulocytes 0 Not Estab. %   Immature Grans (Abs) 0.0 0.0 - 0.1 x10E3/uL  CMP14+EGFR     Status: None   Collection Time: 11/30/22 10:23 AM  Result Value Ref Range   Glucose 73 70 - 99 mg/dL   BUN 11 6 - 24 mg/dL   Creatinine, Ser 9.14 0.57 - 1.00 mg/dL   eGFR 73 >78 GN/FAO/1.30   BUN/Creatinine Ratio 11 9 - 23   Sodium 137 134 - 144 mmol/L   Potassium 4.1 3.5 - 5.2 mmol/L   Chloride 100 96 - 106 mmol/L   CO2 25 20 - 29 mmol/L   Calcium 9.1 8.7 - 10.2 mg/dL   Total Protein 6.8  6.0 - 8.5 g/dL   Albumin 4.4 3.9 - 4.9 g/dL   Globulin, Total 2.4 1.5 - 4.5 g/dL   Albumin/Globulin Ratio 1.8 1.2 - 2.2   Bilirubin Total 0.3 0.0 - 1.2 mg/dL   Alkaline Phosphatase 62 44 - 121 IU/L   AST 18 0 - 40  IU/L   ALT 12 0 - 32 IU/L  TSH     Status: None   Collection Time: 11/30/22 10:23 AM  Result Value Ref Range   TSH 1.510 0.450 - 4.500 uIU/mL  Hemoglobin A1c     Status: Abnormal   Collection Time: 11/30/22 10:23 AM  Result Value Ref Range   Hgb A1c MFr Bld 5.7 (H) 4.8 - 5.6 %    Comment:          Prediabetes: 5.7 - 6.4          Diabetes: >6.4          Glycemic control for adults with diabetes: <7.0    Est. average glucose Bld gHb Est-mCnc 117 mg/dL  Vitamin B14     Status: Abnormal   Collection Time: 11/30/22 10:23 AM  Result Value Ref Range   Vitamin B-12 1,952 (H) 232 - 1,245 pg/mL       Assessment & Plan:   Problem List Items Addressed This Visit   None Visit Diagnoses     B12 deficiency due to diet    -  Primary   Relevant Orders   CBC With Differential (Completed)   CMP14+EGFR (Completed)   Vitamin B12 (Completed)   Vitamin D deficiency, unspecified       Relevant Orders   VITAMIN D 25 Hydroxy (Vit-D Deficiency, Fractures) (Completed)   CBC With Differential (Completed)   CMP14+EGFR (Completed)   Prediabetes       Relevant Orders   CBC With Differential (Completed)   CMP14+EGFR (Completed)   Hemoglobin A1c (Completed)   Mixed hyperlipidemia       Relevant Orders   Lipid panel (Completed)   CBC With Differential (Completed)   CMP14+EGFR (Completed)   Other fatigue       Relevant Orders   CBC With Differential (Completed)   CMP14+EGFR (Completed)   TSH (Completed)       Return in about 3 months (around 03/02/2023).   Total time spent: 20 minutes  Miki Kins, FNP  11/30/2022   This document may have been prepared by [facility] Voice Recognition software and as such may include unintentional dictation errors.

## 2022-12-03 ENCOUNTER — Encounter: Payer: Self-pay | Admitting: Family

## 2022-12-13 DIAGNOSIS — M5412 Radiculopathy, cervical region: Secondary | ICD-10-CM | POA: Diagnosis not present

## 2022-12-13 DIAGNOSIS — M9901 Segmental and somatic dysfunction of cervical region: Secondary | ICD-10-CM | POA: Diagnosis not present

## 2022-12-13 DIAGNOSIS — R519 Headache, unspecified: Secondary | ICD-10-CM | POA: Diagnosis not present

## 2022-12-13 DIAGNOSIS — M9902 Segmental and somatic dysfunction of thoracic region: Secondary | ICD-10-CM | POA: Diagnosis not present

## 2022-12-15 ENCOUNTER — Other Ambulatory Visit: Payer: Self-pay

## 2022-12-15 MED ORDER — PHENTERMINE HCL 37.5 MG PO TABS: 37.5000 mg | ORAL_TABLET | Freq: Every day | ORAL | 0 refills | Status: DC

## 2022-12-27 DIAGNOSIS — R519 Headache, unspecified: Secondary | ICD-10-CM | POA: Diagnosis not present

## 2022-12-27 DIAGNOSIS — M9902 Segmental and somatic dysfunction of thoracic region: Secondary | ICD-10-CM | POA: Diagnosis not present

## 2022-12-27 DIAGNOSIS — M9901 Segmental and somatic dysfunction of cervical region: Secondary | ICD-10-CM | POA: Diagnosis not present

## 2022-12-27 DIAGNOSIS — M5412 Radiculopathy, cervical region: Secondary | ICD-10-CM | POA: Diagnosis not present

## 2023-01-10 ENCOUNTER — Other Ambulatory Visit: Payer: Self-pay | Admitting: Family

## 2023-01-10 DIAGNOSIS — M9902 Segmental and somatic dysfunction of thoracic region: Secondary | ICD-10-CM | POA: Diagnosis not present

## 2023-01-10 DIAGNOSIS — R519 Headache, unspecified: Secondary | ICD-10-CM | POA: Diagnosis not present

## 2023-01-10 DIAGNOSIS — M5412 Radiculopathy, cervical region: Secondary | ICD-10-CM | POA: Diagnosis not present

## 2023-01-10 DIAGNOSIS — M9901 Segmental and somatic dysfunction of cervical region: Secondary | ICD-10-CM | POA: Diagnosis not present

## 2023-01-23 DIAGNOSIS — R519 Headache, unspecified: Secondary | ICD-10-CM | POA: Diagnosis not present

## 2023-01-23 DIAGNOSIS — M9901 Segmental and somatic dysfunction of cervical region: Secondary | ICD-10-CM | POA: Diagnosis not present

## 2023-01-23 DIAGNOSIS — M9902 Segmental and somatic dysfunction of thoracic region: Secondary | ICD-10-CM | POA: Diagnosis not present

## 2023-01-23 DIAGNOSIS — M5412 Radiculopathy, cervical region: Secondary | ICD-10-CM | POA: Diagnosis not present

## 2023-01-31 ENCOUNTER — Other Ambulatory Visit: Payer: Self-pay | Admitting: Family

## 2023-02-01 MED ORDER — PHENTERMINE HCL 37.5 MG PO TABS: 37.5000 mg | ORAL_TABLET | Freq: Every day | ORAL | 0 refills | Status: DC

## 2023-02-07 ENCOUNTER — Other Ambulatory Visit: Payer: Self-pay | Admitting: Family

## 2023-02-07 DIAGNOSIS — M9902 Segmental and somatic dysfunction of thoracic region: Secondary | ICD-10-CM | POA: Diagnosis not present

## 2023-02-07 DIAGNOSIS — M9901 Segmental and somatic dysfunction of cervical region: Secondary | ICD-10-CM | POA: Diagnosis not present

## 2023-02-07 DIAGNOSIS — M5412 Radiculopathy, cervical region: Secondary | ICD-10-CM | POA: Diagnosis not present

## 2023-02-07 DIAGNOSIS — R519 Headache, unspecified: Secondary | ICD-10-CM | POA: Diagnosis not present

## 2023-02-12 DIAGNOSIS — E785 Hyperlipidemia, unspecified: Secondary | ICD-10-CM | POA: Diagnosis not present

## 2023-02-12 DIAGNOSIS — Z7985 Long-term (current) use of injectable non-insulin antidiabetic drugs: Secondary | ICD-10-CM | POA: Diagnosis not present

## 2023-02-12 DIAGNOSIS — Z793 Long term (current) use of hormonal contraceptives: Secondary | ICD-10-CM | POA: Diagnosis not present

## 2023-02-12 DIAGNOSIS — N182 Chronic kidney disease, stage 2 (mild): Secondary | ICD-10-CM | POA: Diagnosis not present

## 2023-02-12 DIAGNOSIS — Z6832 Body mass index (BMI) 32.0-32.9, adult: Secondary | ICD-10-CM | POA: Diagnosis not present

## 2023-02-12 DIAGNOSIS — J301 Allergic rhinitis due to pollen: Secondary | ICD-10-CM | POA: Diagnosis not present

## 2023-02-12 DIAGNOSIS — I129 Hypertensive chronic kidney disease with stage 1 through stage 4 chronic kidney disease, or unspecified chronic kidney disease: Secondary | ICD-10-CM | POA: Diagnosis not present

## 2023-02-12 DIAGNOSIS — E669 Obesity, unspecified: Secondary | ICD-10-CM | POA: Diagnosis not present

## 2023-02-12 DIAGNOSIS — Z888 Allergy status to other drugs, medicaments and biological substances status: Secondary | ICD-10-CM | POA: Diagnosis not present

## 2023-02-12 DIAGNOSIS — E1122 Type 2 diabetes mellitus with diabetic chronic kidney disease: Secondary | ICD-10-CM | POA: Diagnosis not present

## 2023-02-16 DIAGNOSIS — M9901 Segmental and somatic dysfunction of cervical region: Secondary | ICD-10-CM | POA: Diagnosis not present

## 2023-02-16 DIAGNOSIS — R519 Headache, unspecified: Secondary | ICD-10-CM | POA: Diagnosis not present

## 2023-02-16 DIAGNOSIS — M5412 Radiculopathy, cervical region: Secondary | ICD-10-CM | POA: Diagnosis not present

## 2023-02-16 DIAGNOSIS — M9902 Segmental and somatic dysfunction of thoracic region: Secondary | ICD-10-CM | POA: Diagnosis not present

## 2023-02-28 DIAGNOSIS — M9902 Segmental and somatic dysfunction of thoracic region: Secondary | ICD-10-CM | POA: Diagnosis not present

## 2023-02-28 DIAGNOSIS — M9901 Segmental and somatic dysfunction of cervical region: Secondary | ICD-10-CM | POA: Diagnosis not present

## 2023-02-28 DIAGNOSIS — M5412 Radiculopathy, cervical region: Secondary | ICD-10-CM | POA: Diagnosis not present

## 2023-02-28 DIAGNOSIS — R519 Headache, unspecified: Secondary | ICD-10-CM | POA: Diagnosis not present

## 2023-03-06 ENCOUNTER — Ambulatory Visit (INDEPENDENT_AMBULATORY_CARE_PROVIDER_SITE_OTHER): Payer: 59 | Admitting: Family

## 2023-03-06 ENCOUNTER — Encounter: Payer: Self-pay | Admitting: Family

## 2023-03-06 VITALS — BP 130/85 | HR 73 | Ht <= 58 in | Wt 158.4 lb

## 2023-03-06 DIAGNOSIS — R7303 Prediabetes: Secondary | ICD-10-CM | POA: Diagnosis not present

## 2023-03-06 DIAGNOSIS — E669 Obesity, unspecified: Secondary | ICD-10-CM | POA: Diagnosis not present

## 2023-03-06 DIAGNOSIS — E559 Vitamin D deficiency, unspecified: Secondary | ICD-10-CM | POA: Insufficient documentation

## 2023-03-06 DIAGNOSIS — E538 Deficiency of other specified B group vitamins: Secondary | ICD-10-CM | POA: Diagnosis not present

## 2023-03-06 DIAGNOSIS — E782 Mixed hyperlipidemia: Secondary | ICD-10-CM | POA: Diagnosis not present

## 2023-03-06 DIAGNOSIS — R5383 Other fatigue: Secondary | ICD-10-CM | POA: Diagnosis not present

## 2023-03-06 MED ORDER — PHENTERMINE HCL 37.5 MG PO TABS: 37.5000 mg | ORAL_TABLET | Freq: Every day | ORAL | 0 refills | Status: DC

## 2023-03-06 NOTE — Assessment & Plan Note (Signed)
Checking labs today.  Will continue supplements as needed.  

## 2023-03-06 NOTE — Assessment & Plan Note (Signed)

## 2023-03-06 NOTE — Assessment & Plan Note (Addendum)
Stopping Trulicity. Restart Phentermine.   Continue current meds.  Will adjust as needed based on results.  The patient is asked to make an attempt to improve diet and exercise patterns to aid in medical management of this problem. Addressed importance of increasing and maintaining water intake.   Reassess at follow up.

## 2023-03-06 NOTE — Assessment & Plan Note (Signed)
Checking labs today.  Continue current therapy for lipid control. Will modify as needed based on labwork results.  

## 2023-03-06 NOTE — Progress Notes (Signed)
Established Patient Office Visit  Subjective:  Patient ID: Sherry Sanders, female    DOB: [birth year]  Age: 42 y.o. MRN: [id number]  Chief Complaint  Patient presents with   Follow-up    3 mo    Patient is here today for her 3 months follow up.  She has been feeling well since last appointment.   She does have additional concerns to discuss today.  She has been unable to tolerate the Trulicity, as it has been causing significant swelling at the injection sites.  She says it got worse even worse when she increased the dosing.  Asks if we can go back to the phentermine.   Labs are due today. She needs refills.   I have reviewed her active problem list, medication list, allergies, notes from last encounter, lab results for her appointment today.   No other concerns at this time.   Past Medical History:  Diagnosis Date   Fibroid     Past Surgical History:  Procedure Laterality Date   CESAREAN SECTION  2007    Social History   Socioeconomic History   Marital status: Significant Other    Spouse name: Not on file   Number of children: Not on file   Years of education: Not on file   Highest education level: Not on file  Occupational History   Not on file  Tobacco Use   Smoking status: Never   Smokeless tobacco: Never  Substance and Sexual Activity   Alcohol use: No   Drug use: No   Sexual activity: Yes    Birth control/protection: Pill  Other Topics Concern   Not on file  Social History Narrative   Not on file   Social Determinants of Health   Financial Resource Strain: Not on file  Food Insecurity: Not on file  Transportation Needs: Not on file  Physical Activity: Not on file  Stress: Not on file  Social Connections: Not on file  Intimate Partner Violence: Not on file    Family History  Problem Relation Age of Onset   Breast cancer Neg Hx     No Known Allergies  Review of Systems  All other systems reviewed and are negative.       Objective:   BP 130/85   Pulse 73   Ht 4\' 10"  (1.473 m)   Wt 158 lb 6.4 oz (71.8 kg)   SpO2 98%   BMI 33.11 kg/m   Vitals:   03/06/23 0939  BP: 130/85  Pulse: 73  Height: 4\' 10"  (1.473 m)  Weight: 158 lb 6.4 oz (71.8 kg)  SpO2: 98%  BMI (Calculated): 33.11    Physical Exam Vitals and nursing note reviewed.  Constitutional:      General: She is awake. She is not in acute distress.    Appearance: Normal appearance. She is well-developed, well-groomed and overweight. She is not ill-appearing, toxic-appearing or diaphoretic.  HENT:     Head: Normocephalic.  Eyes:     Pupils: Pupils are equal, round, and reactive to light.  Cardiovascular:     Rate and Rhythm: Normal rate.  Pulmonary:     Effort: Pulmonary effort is normal.  Neurological:     Mental Status: She is alert.  Psychiatric:        Behavior: Behavior is cooperative.      No results found for any visits on 03/06/23.  No results found for this or any previous visit (from the past 2160 hour(s)).  Assessment & Plan:   Problem List Items Addressed This Visit       Active Problems   Vitamin D deficiency, unspecified - Primary    Checking labs today.  Will continue supplements as needed.       Relevant Orders   VITAMIN D 25 Hydroxy (Vit-D Deficiency, Fractures)   CMP14+EGFR   TSH   CBC with Differential/Platelet   Prediabetes    Patient educated on foods that contain carbohydrates and the need to decrease intake.  We discussed prediabetes, and what it means and the need for strict dietary control to prevent progression to type 2 diabetes.  Advised to decrease intake of sugary drinks, including sodas, sweet tea, and some juices, and of starch and sugar heavy foods (ie., potatoes, rice, bread, pasta, desserts). She verbalizes understanding and agreement with the changes discussed today.  A1C Continues to be in prediabetic ranges.  Will reassess at follow up after next lab check.  Patient  counseled on dietary choices and verbalized understanding.       Relevant Orders   CMP14+EGFR   TSH   Hemoglobin A1c   CBC with Differential/Platelet   Mixed hyperlipidemia    Checking labs today.  Continue current therapy for lipid control. Will modify as needed based on labwork results.       Relevant Orders   Lipid panel   CMP14+EGFR   TSH   CBC with Differential/Platelet   B12 deficiency due to diet    Checking labs today.  Will continue supplements as needed.       Relevant Orders   CMP14+EGFR   TSH   Vitamin B12   CBC with Differential/Platelet   Obesity (BMI 30.0-34.9)    Stopping Trulicity. Restart Phentermine.   Continue current meds.  Will adjust as needed based on results.  The patient is asked to make an attempt to improve diet and exercise patterns to aid in medical management of this problem. Addressed importance of increasing and maintaining water intake.   Reassess at follow up.        Other Visit Diagnoses     Other fatigue       Relevant Orders   CMP14+EGFR   TSH   CBC with Differential/Platelet       Return in about 3 months (around 06/06/2023).   Total time spent: 30 minutes  Miki Kins, FNP  03/06/2023   This document may have been prepared by [facility] Voice Recognition software and as such may include unintentional dictation errors.

## 2023-03-07 LAB — LIPID PANEL
Chol/HDL Ratio: 2.4 ratio (ref 0.0–4.4)
Cholesterol, Total: 121 mg/dL (ref 100–199)
HDL: 51 mg/dL (ref >39)
LDL Chol Calc (NIH): 60 mg/dL (ref 0–99)
Triglycerides: 41 mg/dL (ref 0–149)
VLDL Cholesterol Cal: 10 mg/dL (ref 5–40)

## 2023-03-07 LAB — HEMOGLOBIN A1C
Est. average glucose Bld gHb Est-mCnc: 111 mg/dL
Hgb A1c MFr Bld: 5.5 % (ref 4.8–5.6)

## 2023-03-07 LAB — CMP14+EGFR
ALT: 15 IU/L (ref 0–32)
AST: 22 IU/L (ref 0–40)
Albumin: 4 g/dL (ref 3.9–4.9)
Alkaline Phosphatase: 65 IU/L (ref 44–121)
BUN/Creatinine Ratio: 17 (ref 9–23)
BUN: 16 mg/dL (ref 6–24)
Bilirubin Total: 0.4 mg/dL (ref 0.0–1.2)
CO2: 23 mmol/L (ref 20–29)
Calcium: 9.2 mg/dL (ref 8.7–10.2)
Chloride: 102 mmol/L (ref 96–106)
Creatinine, Ser: 0.93 mg/dL (ref 0.57–1.00)
Globulin, Total: 2.6 g/dL (ref 1.5–4.5)
Glucose: 74 mg/dL (ref 70–99)
Potassium: 4.4 mmol/L (ref 3.5–5.2)
Sodium: 140 mmol/L (ref 134–144)
Total Protein: 6.6 g/dL (ref 6.0–8.5)
eGFR: 78 mL/min/{1.73_m2} (ref >59)

## 2023-03-07 LAB — TSH: TSH: 1.15 u[IU]/mL (ref 0.450–4.500)

## 2023-03-07 LAB — VITAMIN D 25 HYDROXY (VIT D DEFICIENCY, FRACTURES): Vit D, 25-Hydroxy: 47.1 ng/mL (ref 30.0–100.0)

## 2023-03-07 LAB — VITAMIN B12: Vitamin B-12: 1711 pg/mL — ABNORMAL HIGH (ref 232–1245)

## 2023-03-14 DIAGNOSIS — M9902 Segmental and somatic dysfunction of thoracic region: Secondary | ICD-10-CM | POA: Diagnosis not present

## 2023-03-14 DIAGNOSIS — R519 Headache, unspecified: Secondary | ICD-10-CM | POA: Diagnosis not present

## 2023-03-14 DIAGNOSIS — M9901 Segmental and somatic dysfunction of cervical region: Secondary | ICD-10-CM | POA: Diagnosis not present

## 2023-03-14 DIAGNOSIS — M5412 Radiculopathy, cervical region: Secondary | ICD-10-CM | POA: Diagnosis not present

## 2023-03-21 DIAGNOSIS — J301 Allergic rhinitis due to pollen: Secondary | ICD-10-CM | POA: Diagnosis not present

## 2023-03-28 DIAGNOSIS — M9902 Segmental and somatic dysfunction of thoracic region: Secondary | ICD-10-CM | POA: Diagnosis not present

## 2023-03-28 DIAGNOSIS — R519 Headache, unspecified: Secondary | ICD-10-CM | POA: Diagnosis not present

## 2023-03-28 DIAGNOSIS — M5412 Radiculopathy, cervical region: Secondary | ICD-10-CM | POA: Diagnosis not present

## 2023-03-28 DIAGNOSIS — M9901 Segmental and somatic dysfunction of cervical region: Secondary | ICD-10-CM | POA: Diagnosis not present

## 2023-04-03 ENCOUNTER — Other Ambulatory Visit: Payer: Self-pay | Admitting: Family

## 2023-04-12 DIAGNOSIS — M9902 Segmental and somatic dysfunction of thoracic region: Secondary | ICD-10-CM | POA: Diagnosis not present

## 2023-04-12 DIAGNOSIS — R519 Headache, unspecified: Secondary | ICD-10-CM | POA: Diagnosis not present

## 2023-04-12 DIAGNOSIS — M9901 Segmental and somatic dysfunction of cervical region: Secondary | ICD-10-CM | POA: Diagnosis not present

## 2023-04-12 DIAGNOSIS — M5412 Radiculopathy, cervical region: Secondary | ICD-10-CM | POA: Diagnosis not present

## 2023-04-26 DIAGNOSIS — M5412 Radiculopathy, cervical region: Secondary | ICD-10-CM | POA: Diagnosis not present

## 2023-04-26 DIAGNOSIS — M9902 Segmental and somatic dysfunction of thoracic region: Secondary | ICD-10-CM | POA: Diagnosis not present

## 2023-04-26 DIAGNOSIS — R519 Headache, unspecified: Secondary | ICD-10-CM | POA: Diagnosis not present

## 2023-04-26 DIAGNOSIS — M9901 Segmental and somatic dysfunction of cervical region: Secondary | ICD-10-CM | POA: Diagnosis not present

## 2023-05-09 DIAGNOSIS — M9901 Segmental and somatic dysfunction of cervical region: Secondary | ICD-10-CM | POA: Diagnosis not present

## 2023-05-09 DIAGNOSIS — M9902 Segmental and somatic dysfunction of thoracic region: Secondary | ICD-10-CM | POA: Diagnosis not present

## 2023-05-09 DIAGNOSIS — R519 Headache, unspecified: Secondary | ICD-10-CM | POA: Diagnosis not present

## 2023-05-09 DIAGNOSIS — M5412 Radiculopathy, cervical region: Secondary | ICD-10-CM | POA: Diagnosis not present

## 2023-05-23 DIAGNOSIS — M9901 Segmental and somatic dysfunction of cervical region: Secondary | ICD-10-CM | POA: Diagnosis not present

## 2023-05-23 DIAGNOSIS — M9902 Segmental and somatic dysfunction of thoracic region: Secondary | ICD-10-CM | POA: Diagnosis not present

## 2023-05-23 DIAGNOSIS — R519 Headache, unspecified: Secondary | ICD-10-CM | POA: Diagnosis not present

## 2023-05-23 DIAGNOSIS — M5412 Radiculopathy, cervical region: Secondary | ICD-10-CM | POA: Diagnosis not present

## 2023-06-05 ENCOUNTER — Ambulatory Visit (INDEPENDENT_AMBULATORY_CARE_PROVIDER_SITE_OTHER): Payer: 59 | Admitting: Family

## 2023-06-05 ENCOUNTER — Encounter: Payer: Self-pay | Admitting: Family

## 2023-06-05 ENCOUNTER — Telehealth: Payer: 59 | Admitting: Physician Assistant

## 2023-06-05 VITALS — BP 102/70 | HR 80 | Ht 58.5 in | Wt 158.0 lb

## 2023-06-05 DIAGNOSIS — R7303 Prediabetes: Secondary | ICD-10-CM | POA: Diagnosis not present

## 2023-06-05 DIAGNOSIS — R5383 Other fatigue: Secondary | ICD-10-CM | POA: Diagnosis not present

## 2023-06-05 DIAGNOSIS — E559 Vitamin D deficiency, unspecified: Secondary | ICD-10-CM | POA: Diagnosis not present

## 2023-06-05 DIAGNOSIS — E66811 Obesity, class 1: Secondary | ICD-10-CM

## 2023-06-05 DIAGNOSIS — E538 Deficiency of other specified B group vitamins: Secondary | ICD-10-CM | POA: Diagnosis not present

## 2023-06-05 DIAGNOSIS — E782 Mixed hyperlipidemia: Secondary | ICD-10-CM | POA: Diagnosis not present

## 2023-06-05 DIAGNOSIS — B3731 Acute candidiasis of vulva and vagina: Secondary | ICD-10-CM

## 2023-06-05 MED ORDER — FLUCONAZOLE 150 MG PO TABS: ORAL_TABLET | ORAL | 0 refills | Status: DC

## 2023-06-05 NOTE — Progress Notes (Signed)

## 2023-06-05 NOTE — Progress Notes (Signed)
Established Patient Office Visit  Subjective:  Patient ID: Sherry Sanders, female    DOB: [birth year]  Age: 42 y.o. MRN: [id number]  Chief Complaint  Patient presents with   Follow-up    3 m0    Patient is here today for her 3 months follow up.  She has been feeling well since last appointment.   She does not have additional concerns to discuss today.  Labs are due today. She needs refills.   I have reviewed her active problem list, medication list, allergies, notes from last encounter, lab results for her appointment today.      No other concerns at this time.   Past Medical History:  Diagnosis Date   Fibroid     Past Surgical History:  Procedure Laterality Date   CESAREAN SECTION  2007    Social History   Socioeconomic History   Marital status: Significant Other    Spouse name: Not on file   Number of children: Not on file   Years of education: Not on file   Highest education level: Not on file  Occupational History   Not on file  Tobacco Use   Smoking status: Never   Smokeless tobacco: Never  Substance and Sexual Activity   Alcohol use: No   Drug use: No   Sexual activity: Yes    Birth control/protection: Pill  Other Topics Concern   Not on file  Social History Narrative   Not on file   Social Determinants of Health   Financial Resource Strain: Not on file  Food Insecurity: Not on file  Transportation Needs: Not on file  Physical Activity: Not on file  Stress: Not on file  Social Connections: Not on file  Intimate Partner Violence: Not on file    Family History  Problem Relation Age of Onset   Breast cancer Neg Hx     No Known Allergies  Review of Systems  All other systems reviewed and are negative.      Objective:   BP 102/70   Pulse 80   Ht 4' 10.5" (1.486 m)   Wt 158 lb (71.7 kg)   SpO2 99%   BMI 32.46 kg/m   Vitals:   06/05/23 0946  BP: 102/70  Pulse: 80  Height: 4' 10.5" (1.486 m)  Weight: 158 lb (71.7 kg)   SpO2: 99%  BMI (Calculated): 32.46    Physical Exam Vitals and nursing note reviewed.  Constitutional:      Appearance: Normal appearance. She is normal weight.  HENT:     Head: Normocephalic.  Eyes:     Extraocular Movements: Extraocular movements intact.     Conjunctiva/sclera: Conjunctivae normal.     Pupils: Pupils are equal, round, and reactive to light.  Cardiovascular:     Rate and Rhythm: Normal rate.  Pulmonary:     Effort: Pulmonary effort is normal.  Neurological:     General: No focal deficit present.     Mental Status: She is alert and oriented to person, place, and time. Mental status is at baseline.  Psychiatric:        Mood and Affect: Mood normal.        Behavior: Behavior normal.        Thought Content: Thought content normal.        Judgment: Judgment normal.      Results for orders placed or performed in visit on 06/05/23  Lipid panel  Result Value Ref Range   Cholesterol, Total  121 100 - 199 mg/dL   Triglycerides 48 0 - 149 mg/dL   HDL 53 >96 mg/dL   VLDL Cholesterol Cal 11 5 - 40 mg/dL   LDL Chol Calc (NIH) 57 0 - 99 mg/dL   Chol/HDL Ratio 2.3 0.0 - 4.4 ratio  VITAMIN D 25 Hydroxy (Vit-D Deficiency, Fractures)  Result Value Ref Range   Vit D, 25-Hydroxy 50.9 30.0 - 100.0 ng/mL  CMP14+EGFR  Result Value Ref Range   Glucose 83 70 - 99 mg/dL   BUN 19 6 - 24 mg/dL   Creatinine, Ser 0.45 (H) 0.57 - 1.00 mg/dL   eGFR 63 >40 JW/JXB/1.47   BUN/Creatinine Ratio 17 9 - 23   Sodium 138 134 - 144 mmol/L   Potassium 4.4 3.5 - 5.2 mmol/L   Chloride 101 96 - 106 mmol/L   CO2 24 20 - 29 mmol/L   Calcium 9.4 8.7 - 10.2 mg/dL   Total Protein 6.9 6.0 - 8.5 g/dL   Albumin 4.4 3.9 - 4.9 g/dL   Globulin, Total 2.5 1.5 - 4.5 g/dL   Bilirubin Total 0.4 0.0 - 1.2 mg/dL   Alkaline Phosphatase 69 44 - 121 IU/L   AST 20 0 - 40 IU/L   ALT 18 0 - 32 IU/L  TSH  Result Value Ref Range   TSH 1.680 0.450 - 4.500 uIU/mL  Hemoglobin A1c  Result Value Ref Range    Hgb A1c MFr Bld 5.8 (H) 4.8 - 5.6 %   Est. average glucose Bld gHb Est-mCnc 120 mg/dL  Vitamin W29  Result Value Ref Range   Vitamin B-12 >2000 (H) 232 - 1245 pg/mL  CBC with Diff  Result Value Ref Range   WBC 7.5 3.4 - 10.8 x10E3/uL   RBC 4.49 3.77 - 5.28 x10E6/uL   Hemoglobin 13.8 11.1 - 15.9 g/dL   Hematocrit 56.2 13.0 - 46.6 %   MCV 95 79 - 97 fL   MCH 30.7 26.6 - 33.0 pg   MCHC 32.5 31.5 - 35.7 g/dL   RDW 86.5 78.4 - 69.6 %   Platelets 253 150 - 450 x10E3/uL   Neutrophils 59 Not Estab. %   Lymphs 33 Not Estab. %   Monocytes 6 Not Estab. %   Eos 1 Not Estab. %   Basos 1 Not Estab. %   Neutrophils Absolute 4.4 1.4 - 7.0 x10E3/uL   Lymphocytes Absolute 2.5 0.7 - 3.1 x10E3/uL   Monocytes Absolute 0.5 0.1 - 0.9 x10E3/uL   EOS (ABSOLUTE) 0.1 0.0 - 0.4 x10E3/uL   Basophils Absolute 0.0 0.0 - 0.2 x10E3/uL   Immature Granulocytes 0 Not Estab. %   Immature Grans (Abs) 0.0 0.0 - 0.1 x10E3/uL    Recent Results (from the past 2160 hour(s))  Lipid panel     Status: None   Collection Time: 06/05/23  1:04 PM  Result Value Ref Range   Cholesterol, Total 121 100 - 199 mg/dL   Triglycerides 48 0 - 149 mg/dL   HDL 53 >29 mg/dL   VLDL Cholesterol Cal 11 5 - 40 mg/dL   LDL Chol Calc (NIH) 57 0 - 99 mg/dL   Chol/HDL Ratio 2.3 0.0 - 4.4 ratio    Comment:                                   T. Chol/HDL Ratio  Men  Women                               1/2 Avg.Risk  3.4    3.3                                   Avg.Risk  5.0    4.4                                2X Avg.Risk  9.6    7.1                                3X Avg.Risk 23.4   11.0   VITAMIN D 25 Hydroxy (Vit-D Deficiency, Fractures)     Status: None   Collection Time: 06/05/23  1:04 PM  Result Value Ref Range   Vit D, 25-Hydroxy 50.9 30.0 - 100.0 ng/mL    Comment: Vitamin D deficiency has been defined by the Institute of Medicine and an Endocrine Society practice guideline as a level of  serum 25-OH vitamin D less than 20 ng/mL (1,2). The Endocrine Society went on to further define vitamin D insufficiency as a level between 21 and 29 ng/mL (2). 1. IOM (Institute of Medicine). 2010. Dietary reference    intakes for calcium and D. [place]: The    [employer]. 2. Holick MF, Binkley Lavaca, Bischoff-Ferrari HA, et al.    Evaluation, treatment, and prevention of vitamin D    deficiency: an Endocrine Society clinical practice    guideline. JCEM. 2011 Jul; 96(7):1911-30.   CMP14+EGFR     Status: Abnormal   Collection Time: 06/05/23  1:04 PM  Result Value Ref Range   Glucose 83 70 - 99 mg/dL   BUN 19 6 - 24 mg/dL   Creatinine, Ser 1.61 (H) 0.57 - 1.00 mg/dL   eGFR 63 >09 UE/AVW/0.98   BUN/Creatinine Ratio 17 9 - 23   Sodium 138 134 - 144 mmol/L   Potassium 4.4 3.5 - 5.2 mmol/L   Chloride 101 96 - 106 mmol/L   CO2 24 20 - 29 mmol/L   Calcium 9.4 8.7 - 10.2 mg/dL   Total Protein 6.9 6.0 - 8.5 g/dL   Albumin 4.4 3.9 - 4.9 g/dL   Globulin, Total 2.5 1.5 - 4.5 g/dL   Bilirubin Total 0.4 0.0 - 1.2 mg/dL   Alkaline Phosphatase 69 44 - 121 IU/L   AST 20 0 - 40 IU/L   ALT 18 0 - 32 IU/L  TSH     Status: None   Collection Time: 06/05/23  1:04 PM  Result Value Ref Range   TSH 1.680 0.450 - 4.500 uIU/mL  Hemoglobin A1c     Status: Abnormal   Collection Time: 06/05/23  1:04 PM  Result Value Ref Range   Hgb A1c MFr Bld 5.8 (H) 4.8 - 5.6 %    Comment:          Prediabetes: 5.7 - 6.4          Diabetes: >6.4          Glycemic control for adults with diabetes: <7.0    Est. average glucose Bld gHb Est-mCnc 120 mg/dL  Vitamin J19     Status:  Abnormal   Collection Time: 06/05/23  1:04 PM  Result Value Ref Range   Vitamin B-12 >2000 (H) 232 - 1245 pg/mL  CBC with Diff     Status: None   Collection Time: 06/05/23  1:04 PM  Result Value Ref Range   WBC 7.5 3.4 - 10.8 x10E3/uL    Comment: **Effective June 11, 2023 profile 161096 WBC will be made**    non-orderable as a stand-alone order code.    RBC 4.49 3.77 - 5.28 x10E6/uL   Hemoglobin 13.8 11.1 - 15.9 g/dL   Hematocrit 04.5 40.9 - 46.6 %   MCV 95 79 - 97 fL   MCH 30.7 26.6 - 33.0 pg   MCHC 32.5 31.5 - 35.7 g/dL   RDW 81.1 91.4 - 78.2 %   Platelets 253 150 - 450 x10E3/uL   Neutrophils 59 Not Estab. %   Lymphs 33 Not Estab. %   Monocytes 6 Not Estab. %   Eos 1 Not Estab. %   Basos 1 Not Estab. %   Neutrophils Absolute 4.4 1.4 - 7.0 x10E3/uL   Lymphocytes Absolute 2.5 0.7 - 3.1 x10E3/uL   Monocytes Absolute 0.5 0.1 - 0.9 x10E3/uL   EOS (ABSOLUTE) 0.1 0.0 - 0.4 x10E3/uL   Basophils Absolute 0.0 0.0 - 0.2 x10E3/uL   Immature Granulocytes 0 Not Estab. %   Immature Grans (Abs) 0.0 0.0 - 0.1 x10E3/uL       Assessment & Plan:   Problem List Items Addressed This Visit       Active Problems   Vitamin D deficiency, unspecified - Primary    Checking labs today.  Will continue supplements as needed.        Relevant Orders   VITAMIN D 25 Hydroxy (Vit-D Deficiency, Fractures) (Completed)   CMP14+EGFR (Completed)   CBC with Diff (Completed)   Prediabetes    A1C Continues to be in prediabetic ranges.  Will reassess at follow up after next lab check.  Patient counseled on dietary choices and verbalized understanding.  Patient educated on foods that contain carbohydrates and the need to decrease intake.  We discussed prediabetes, and what it means and the need for strict dietary control to prevent progression to type 2 diabetes.  Advised to decrease intake of sugary drinks, including sodas, sweet tea, and some juices, and of starch and sugar heavy foods (ie., potatoes, rice, bread, pasta, desserts). She verbalizes understanding and agreement with the changes discussed today.         Relevant Orders   CMP14+EGFR (Completed)   Hemoglobin A1c (Completed)   CBC with Diff (Completed)   Mixed hyperlipidemia    Checking labs today.  Continue current therapy for lipid  control. Will modify as needed based on labwork results.       Relevant Orders   Lipid panel (Completed)   CMP14+EGFR (Completed)   CBC with Diff (Completed)   B12 deficiency due to diet    Checking labs today.  Will continue supplements as needed.        Relevant Orders   CMP14+EGFR (Completed)   Vitamin B12 (Completed)   CBC with Diff (Completed)   Obesity (BMI 30.0-34.9)    Continue current meds.  Will adjust as needed based on results.  The patient is asked to make an attempt to improve diet and exercise patterns to aid in medical management of this problem. Addressed importance of increasing and maintaining water intake.       Other Visit Diagnoses  Other fatigue       Relevant Orders   CMP14+EGFR (Completed)   TSH (Completed)   CBC with Diff (Completed)       Return in about 4 months (around 10/03/2023) for F/U.   Total time spent: 30 minutes  Miki Kins, FNP  06/05/2023   This document may have been prepared by [facility] Voice Recognition software and as such may include unintentional dictation errors.

## 2023-06-05 NOTE — Progress Notes (Signed)
I have spent 5 minutes in review of e-visit questionnaire, review and updating patient chart, medical decision making and response to patient.   Mia Milan Cody Jacklynn Dehaas, PA-C    

## 2023-06-06 DIAGNOSIS — M9901 Segmental and somatic dysfunction of cervical region: Secondary | ICD-10-CM | POA: Diagnosis not present

## 2023-06-06 DIAGNOSIS — M9902 Segmental and somatic dysfunction of thoracic region: Secondary | ICD-10-CM | POA: Diagnosis not present

## 2023-06-06 DIAGNOSIS — R519 Headache, unspecified: Secondary | ICD-10-CM | POA: Diagnosis not present

## 2023-06-06 DIAGNOSIS — M5412 Radiculopathy, cervical region: Secondary | ICD-10-CM | POA: Diagnosis not present

## 2023-06-06 LAB — CBC WITH DIFFERENTIAL/PLATELET
Basophils Absolute: 0 10*3/uL (ref 0.0–0.2)
Basos: 1 %
EOS (ABSOLUTE): 0.1 10*3/uL (ref 0.0–0.4)
Eos: 1 %
Hematocrit: 42.5 % (ref 34.0–46.6)
Hemoglobin: 13.8 g/dL (ref 11.1–15.9)
Immature Grans (Abs): 0 10*3/uL (ref 0.0–0.1)
Immature Granulocytes: 0 %
Lymphocytes Absolute: 2.5 10*3/uL (ref 0.7–3.1)
Lymphs: 33 %
MCH: 30.7 pg (ref 26.6–33.0)
MCHC: 32.5 g/dL (ref 31.5–35.7)
MCV: 95 fL (ref 79–97)
Monocytes Absolute: 0.5 10*3/uL (ref 0.1–0.9)
Monocytes: 6 %
Neutrophils Absolute: 4.4 10*3/uL (ref 1.4–7.0)
Neutrophils: 59 %
Platelets: 253 10*3/uL (ref 150–450)
RBC: 4.49 x10E6/uL (ref 3.77–5.28)
RDW: 12.3 % (ref 11.7–15.4)
WBC: 7.5 10*3/uL (ref 3.4–10.8)

## 2023-06-06 LAB — VITAMIN B12: Vitamin B-12: >2000 pg/mL — ABNORMAL HIGH (ref 232–1245)

## 2023-06-06 LAB — LIPID PANEL
Chol/HDL Ratio: 2.3 {ratio} (ref 0.0–4.4)
Cholesterol, Total: 121 mg/dL (ref 100–199)
HDL: 53 mg/dL (ref >39)
LDL Chol Calc (NIH): 57 mg/dL (ref 0–99)
Triglycerides: 48 mg/dL (ref 0–149)
VLDL Cholesterol Cal: 11 mg/dL (ref 5–40)

## 2023-06-06 LAB — HEMOGLOBIN A1C
Est. average glucose Bld gHb Est-mCnc: 120 mg/dL
Hgb A1c MFr Bld: 5.8 % — ABNORMAL HIGH (ref 4.8–5.6)

## 2023-06-06 LAB — CMP14+EGFR
ALT: 18 [IU]/L (ref 0–32)
AST: 20 [IU]/L (ref 0–40)
Albumin: 4.4 g/dL (ref 3.9–4.9)
Alkaline Phosphatase: 69 [IU]/L (ref 44–121)
BUN/Creatinine Ratio: 17 (ref 9–23)
BUN: 19 mg/dL (ref 6–24)
Bilirubin Total: 0.4 mg/dL (ref 0.0–1.2)
CO2: 24 mmol/L (ref 20–29)
Calcium: 9.4 mg/dL (ref 8.7–10.2)
Chloride: 101 mmol/L (ref 96–106)
Creatinine, Ser: 1.11 mg/dL — ABNORMAL HIGH (ref 0.57–1.00)
Globulin, Total: 2.5 g/dL (ref 1.5–4.5)
Glucose: 83 mg/dL (ref 70–99)
Potassium: 4.4 mmol/L (ref 3.5–5.2)
Sodium: 138 mmol/L (ref 134–144)
Total Protein: 6.9 g/dL (ref 6.0–8.5)
eGFR: 63 mL/min/{1.73_m2} (ref >59)

## 2023-06-06 LAB — VITAMIN D 25 HYDROXY (VIT D DEFICIENCY, FRACTURES): Vit D, 25-Hydroxy: 50.9 ng/mL (ref 30.0–100.0)

## 2023-06-06 LAB — TSH: TSH: 1.68 u[IU]/mL (ref 0.450–4.500)

## 2023-06-09 ENCOUNTER — Encounter: Payer: Self-pay | Admitting: Family

## 2023-06-09 NOTE — Assessment & Plan Note (Signed)
Continue current meds.  Will adjust as needed based on results.  The patient is asked to make an attempt to improve diet and exercise patterns to aid in medical management of this problem. Addressed importance of increasing and maintaining water intake.   

## 2023-06-09 NOTE — Assessment & Plan Note (Signed)
Checking labs today.  Will continue supplements as needed.  

## 2023-06-09 NOTE — Assessment & Plan Note (Signed)
Checking labs today.  Continue current therapy for lipid control. Will modify as needed based on labwork results.  

## 2023-06-09 NOTE — Assessment & Plan Note (Signed)

## 2023-06-18 ENCOUNTER — Other Ambulatory Visit: Payer: Self-pay | Admitting: Family

## 2023-06-18 MED ORDER — PHENTERMINE HCL 37.5 MG PO TABS: 37.5000 mg | ORAL_TABLET | Freq: Every day | ORAL | 2 refills | Status: DC

## 2023-06-20 DIAGNOSIS — R519 Headache, unspecified: Secondary | ICD-10-CM | POA: Diagnosis not present

## 2023-06-20 DIAGNOSIS — M5412 Radiculopathy, cervical region: Secondary | ICD-10-CM | POA: Diagnosis not present

## 2023-06-20 DIAGNOSIS — M9901 Segmental and somatic dysfunction of cervical region: Secondary | ICD-10-CM | POA: Diagnosis not present

## 2023-06-20 DIAGNOSIS — M9902 Segmental and somatic dysfunction of thoracic region: Secondary | ICD-10-CM | POA: Diagnosis not present

## 2023-07-03 DIAGNOSIS — M9902 Segmental and somatic dysfunction of thoracic region: Secondary | ICD-10-CM | POA: Diagnosis not present

## 2023-07-03 DIAGNOSIS — M9901 Segmental and somatic dysfunction of cervical region: Secondary | ICD-10-CM | POA: Diagnosis not present

## 2023-07-03 DIAGNOSIS — R519 Headache, unspecified: Secondary | ICD-10-CM | POA: Diagnosis not present

## 2023-07-03 DIAGNOSIS — M5412 Radiculopathy, cervical region: Secondary | ICD-10-CM | POA: Diagnosis not present

## 2023-07-31 ENCOUNTER — Other Ambulatory Visit: Payer: Self-pay | Admitting: Family

## 2023-08-01 ENCOUNTER — Other Ambulatory Visit: Payer: Self-pay | Admitting: Family

## 2023-08-06 MED ORDER — PHENTERMINE HCL 37.5 MG PO TABS: 37.5000 mg | ORAL_TABLET | Freq: Every day | ORAL | 0 refills | Status: DC

## 2023-08-23 DIAGNOSIS — R519 Headache, unspecified: Secondary | ICD-10-CM | POA: Diagnosis not present

## 2023-08-23 DIAGNOSIS — M9902 Segmental and somatic dysfunction of thoracic region: Secondary | ICD-10-CM | POA: Diagnosis not present

## 2023-08-23 DIAGNOSIS — M9901 Segmental and somatic dysfunction of cervical region: Secondary | ICD-10-CM | POA: Diagnosis not present

## 2023-08-23 DIAGNOSIS — M5412 Radiculopathy, cervical region: Secondary | ICD-10-CM | POA: Diagnosis not present

## 2023-09-05 DIAGNOSIS — R519 Headache, unspecified: Secondary | ICD-10-CM | POA: Diagnosis not present

## 2023-09-05 DIAGNOSIS — M9901 Segmental and somatic dysfunction of cervical region: Secondary | ICD-10-CM | POA: Diagnosis not present

## 2023-09-05 DIAGNOSIS — M5412 Radiculopathy, cervical region: Secondary | ICD-10-CM | POA: Diagnosis not present

## 2023-09-05 DIAGNOSIS — M9902 Segmental and somatic dysfunction of thoracic region: Secondary | ICD-10-CM | POA: Diagnosis not present

## 2023-09-19 DIAGNOSIS — M5412 Radiculopathy, cervical region: Secondary | ICD-10-CM | POA: Diagnosis not present

## 2023-09-19 DIAGNOSIS — M9901 Segmental and somatic dysfunction of cervical region: Secondary | ICD-10-CM | POA: Diagnosis not present

## 2023-09-19 DIAGNOSIS — M9902 Segmental and somatic dysfunction of thoracic region: Secondary | ICD-10-CM | POA: Diagnosis not present

## 2023-09-19 DIAGNOSIS — R519 Headache, unspecified: Secondary | ICD-10-CM | POA: Diagnosis not present

## 2023-10-04 ENCOUNTER — Ambulatory Visit: Payer: 59 | Admitting: Family

## 2023-10-04 DIAGNOSIS — M5412 Radiculopathy, cervical region: Secondary | ICD-10-CM | POA: Diagnosis not present

## 2023-10-04 DIAGNOSIS — R519 Headache, unspecified: Secondary | ICD-10-CM | POA: Diagnosis not present

## 2023-10-04 DIAGNOSIS — M9902 Segmental and somatic dysfunction of thoracic region: Secondary | ICD-10-CM | POA: Diagnosis not present

## 2023-10-04 DIAGNOSIS — M9901 Segmental and somatic dysfunction of cervical region: Secondary | ICD-10-CM | POA: Diagnosis not present

## 2023-10-09 DIAGNOSIS — H15102 Unspecified episcleritis, left eye: Secondary | ICD-10-CM | POA: Diagnosis not present

## 2023-10-16 DIAGNOSIS — H15102 Unspecified episcleritis, left eye: Secondary | ICD-10-CM | POA: Diagnosis not present

## 2023-10-17 DIAGNOSIS — M9902 Segmental and somatic dysfunction of thoracic region: Secondary | ICD-10-CM | POA: Diagnosis not present

## 2023-10-17 DIAGNOSIS — M5412 Radiculopathy, cervical region: Secondary | ICD-10-CM | POA: Diagnosis not present

## 2023-10-17 DIAGNOSIS — M9901 Segmental and somatic dysfunction of cervical region: Secondary | ICD-10-CM | POA: Diagnosis not present

## 2023-10-17 DIAGNOSIS — R519 Headache, unspecified: Secondary | ICD-10-CM | POA: Diagnosis not present

## 2023-10-31 DIAGNOSIS — M9901 Segmental and somatic dysfunction of cervical region: Secondary | ICD-10-CM | POA: Diagnosis not present

## 2023-10-31 DIAGNOSIS — R519 Headache, unspecified: Secondary | ICD-10-CM | POA: Diagnosis not present

## 2023-10-31 DIAGNOSIS — M5412 Radiculopathy, cervical region: Secondary | ICD-10-CM | POA: Diagnosis not present

## 2023-10-31 DIAGNOSIS — M9902 Segmental and somatic dysfunction of thoracic region: Secondary | ICD-10-CM | POA: Diagnosis not present

## 2023-11-14 ENCOUNTER — Encounter: Payer: Self-pay | Admitting: Family

## 2023-11-14 ENCOUNTER — Ambulatory Visit (INDEPENDENT_AMBULATORY_CARE_PROVIDER_SITE_OTHER): Admitting: Family

## 2023-11-14 VITALS — BP 104/76 | HR 98 | Ht <= 58 in | Wt 163.6 lb

## 2023-11-14 DIAGNOSIS — R7303 Prediabetes: Secondary | ICD-10-CM | POA: Diagnosis not present

## 2023-11-14 DIAGNOSIS — E66811 Obesity, class 1: Secondary | ICD-10-CM

## 2023-11-14 DIAGNOSIS — E538 Deficiency of other specified B group vitamins: Secondary | ICD-10-CM

## 2023-11-14 DIAGNOSIS — I10 Essential (primary) hypertension: Secondary | ICD-10-CM | POA: Diagnosis not present

## 2023-11-14 DIAGNOSIS — E559 Vitamin D deficiency, unspecified: Secondary | ICD-10-CM | POA: Diagnosis not present

## 2023-11-14 DIAGNOSIS — E782 Mixed hyperlipidemia: Secondary | ICD-10-CM

## 2023-11-14 DIAGNOSIS — M5412 Radiculopathy, cervical region: Secondary | ICD-10-CM | POA: Diagnosis not present

## 2023-11-14 DIAGNOSIS — M9902 Segmental and somatic dysfunction of thoracic region: Secondary | ICD-10-CM | POA: Diagnosis not present

## 2023-11-14 DIAGNOSIS — M9901 Segmental and somatic dysfunction of cervical region: Secondary | ICD-10-CM | POA: Diagnosis not present

## 2023-11-14 DIAGNOSIS — R519 Headache, unspecified: Secondary | ICD-10-CM | POA: Diagnosis not present

## 2023-11-14 MED ORDER — PHENTERMINE HCL 37.5 MG PO TABS: 37.5000 mg | ORAL_TABLET | Freq: Every day | ORAL | 2 refills | Status: DC

## 2023-11-14 NOTE — Assessment & Plan Note (Signed)
 Checking labs today.  Continue current therapy for lipid control. Will modify as needed based on labwork results.

## 2023-11-14 NOTE — Progress Notes (Signed)
 Established Patient Office Visit  Subjective:  Patient ID: Sherry Sanders, female    DOB: 1980-11-05  Age: 43 y.o. MRN: [id number]  Chief Complaint  Patient presents with   Follow-up    Patient is here today for her 4 months follow up.  She has been feeling fairly well since last appointment.   She does not have additional concerns to discuss today.  Labs are due today. She needs refills.   I have reviewed her active problem list, medication list, allergies, health maintenance, notes from last encounter, lab results for her appointment today.      No other concerns at this time.   Past Medical History:  Diagnosis Date   Fibroid     Past Surgical History:  Procedure Laterality Date   CESAREAN SECTION  2007    Social History   Socioeconomic History   Marital status: Significant Other    Spouse name: Not on file   Number of children: Not on file   Years of education: Not on file   Highest education level: Not on file  Occupational History   Not on file  Tobacco Use   Smoking status: Never   Smokeless tobacco: Never  Substance and Sexual Activity   Alcohol use: No   Drug use: No   Sexual activity: Yes    Birth control/protection: Pill  Other Topics Concern   Not on file  Social History Narrative   Not on file   Social Drivers of Health   Financial Resource Strain: Not on file  Food Insecurity: Not on file  Transportation Needs: Not on file  Physical Activity: Not on file  Stress: Not on file  Social Connections: Not on file  Intimate Partner Violence: Not on file    Family History  Problem Relation Age of Onset   Breast cancer Neg Hx     No Known Allergies  Review of Systems  All other systems reviewed and are negative.      Objective:   BP 104/76   Pulse 98   Ht 4\' 10"  (1.473 m)   Wt 163 lb 9.6 oz (74.2 kg)   SpO2 99%   BMI 34.19 kg/m   Vitals:   11/14/23 0857  BP: 104/76  Pulse: 98  Height: 4\' 10"  (1.473 m)  Weight:  163 lb 9.6 oz (74.2 kg)  SpO2: 99%  BMI (Calculated): 34.2    Physical Exam Vitals and nursing note reviewed.  Constitutional:      Appearance: Normal appearance. She is normal weight.  HENT:     Head: Normocephalic.  Eyes:     Extraocular Movements: Extraocular movements intact.     Conjunctiva/sclera: Conjunctivae normal.     Pupils: Pupils are equal, round, and reactive to light.  Cardiovascular:     Rate and Rhythm: Normal rate.  Pulmonary:     Effort: Pulmonary effort is normal.  Neurological:     General: No focal deficit present.     Mental Status: She is alert and oriented to person, place, and time. Mental status is at baseline.  Psychiatric:        Mood and Affect: Mood normal.        Behavior: Behavior normal.        Thought Content: Thought content normal.        Judgment: Judgment normal.      No results found for any visits on 11/14/23.  No results found for this or any previous visit (from the  past 2160 hours).     Assessment & Plan:   Problem List Items Addressed This Visit       Other   Vitamin D  deficiency, unspecified - Primary   Checking labs today.  Will continue supplements as needed.        Prediabetes   Patient educated on foods that contain carbohydrates and the need to decrease intake.  We discussed prediabetes, and what it means and the need for strict dietary control to prevent progression to type 2 diabetes.  Advised to decrease intake of sugary drinks, including sodas, sweet tea, and some juices, and of starch and sugar heavy foods (ie., potatoes, rice, bread, pasta, desserts). She verbalizes understanding and agreement with the changes discussed today.  A1C Continues to be in prediabetic ranges.  Will reassess at follow up after next lab check.  Patient counseled on dietary choices and verbalized understanding.        Mixed hyperlipidemia   Checking labs today.  Continue current therapy for lipid control. Will modify as  needed based on labwork results.        B12 deficiency due to diet   Checking labs today.  Will continue supplements as needed.        Obesity (BMI 30.0-34.9)   Continue current meds.  Will adjust as needed based on results.  The patient is asked to make an attempt to improve diet and exercise patterns to aid in medical management of this problem. Addressed importance of increasing and maintaining water intake.       Other Visit Diagnoses       Essential hypertension, benign       Blood pressure well controlled with current medications.  Continue current therapy.  Will reassess at follow up.       Return in about 3 months (around 02/14/2024) for F/U.   Total time spent: 20 minutes  Trenda Frisk, FNP  11/14/2023   This document may have been prepared by [facility] Voice Recognition software and as such may include unintentional dictation errors.

## 2023-11-14 NOTE — Assessment & Plan Note (Signed)
 Checking labs today.  Will continue supplements as needed.

## 2023-11-14 NOTE — Assessment & Plan Note (Signed)

## 2023-11-14 NOTE — Assessment & Plan Note (Signed)
 Continue current meds.  Will adjust as needed based on results.  The patient is asked to make an attempt to improve diet and exercise patterns to aid in medical management of this problem. Addressed importance of increasing and maintaining water intake.

## 2023-11-15 ENCOUNTER — Other Ambulatory Visit

## 2023-11-15 DIAGNOSIS — R7303 Prediabetes: Secondary | ICD-10-CM

## 2023-11-15 DIAGNOSIS — E538 Deficiency of other specified B group vitamins: Secondary | ICD-10-CM | POA: Diagnosis not present

## 2023-11-15 DIAGNOSIS — R5383 Other fatigue: Secondary | ICD-10-CM

## 2023-11-15 DIAGNOSIS — E66811 Obesity, class 1: Secondary | ICD-10-CM

## 2023-11-15 DIAGNOSIS — E782 Mixed hyperlipidemia: Secondary | ICD-10-CM | POA: Diagnosis not present

## 2023-11-15 DIAGNOSIS — I10 Essential (primary) hypertension: Secondary | ICD-10-CM | POA: Diagnosis not present

## 2023-11-15 DIAGNOSIS — E559 Vitamin D deficiency, unspecified: Secondary | ICD-10-CM | POA: Diagnosis not present

## 2023-11-16 LAB — VITAMIN D 25 HYDROXY (VIT D DEFICIENCY, FRACTURES): Vit D, 25-Hydroxy: 44 ng/mL (ref 30.0–100.0)

## 2023-11-16 LAB — LIPID PANEL
Chol/HDL Ratio: 2.4 ratio (ref 0.0–4.4)
Cholesterol, Total: 123 mg/dL (ref 100–199)
HDL: 52 mg/dL (ref >39)
LDL Chol Calc (NIH): 59 mg/dL (ref 0–99)
Triglycerides: 50 mg/dL (ref 0–149)
VLDL Cholesterol Cal: 12 mg/dL (ref 5–40)

## 2023-11-16 LAB — CMP14+EGFR
ALT: 17 IU/L (ref 0–32)
AST: 18 IU/L (ref 0–40)
Albumin: 4.2 g/dL (ref 3.9–4.9)
Alkaline Phosphatase: 67 IU/L (ref 44–121)
BUN/Creatinine Ratio: 10 (ref 9–23)
BUN: 13 mg/dL (ref 6–24)
Bilirubin Total: 0.3 mg/dL (ref 0.0–1.2)
CO2: 23 mmol/L (ref 20–29)
Calcium: 9.3 mg/dL (ref 8.7–10.2)
Chloride: 103 mmol/L (ref 96–106)
Creatinine, Ser: 1.26 mg/dL — ABNORMAL HIGH (ref 0.57–1.00)
Globulin, Total: 2.7 g/dL (ref 1.5–4.5)
Glucose: 78 mg/dL (ref 70–99)
Potassium: 4.6 mmol/L (ref 3.5–5.2)
Sodium: 139 mmol/L (ref 134–144)
Total Protein: 6.9 g/dL (ref 6.0–8.5)
eGFR: 54 mL/min/{1.73_m2} — ABNORMAL LOW (ref >59)

## 2023-11-16 LAB — TSH: TSH: 1.6 u[IU]/mL (ref 0.450–4.500)

## 2023-11-16 LAB — VITAMIN B12: Vitamin B-12: 1327 pg/mL — ABNORMAL HIGH (ref 232–1245)

## 2023-11-16 LAB — HEMOGLOBIN A1C
Est. average glucose Bld gHb Est-mCnc: 123 mg/dL
Hgb A1c MFr Bld: 5.9 % — ABNORMAL HIGH (ref 4.8–5.6)

## 2023-11-28 DIAGNOSIS — M9901 Segmental and somatic dysfunction of cervical region: Secondary | ICD-10-CM | POA: Diagnosis not present

## 2023-11-28 DIAGNOSIS — M9902 Segmental and somatic dysfunction of thoracic region: Secondary | ICD-10-CM | POA: Diagnosis not present

## 2023-11-28 DIAGNOSIS — M5412 Radiculopathy, cervical region: Secondary | ICD-10-CM | POA: Diagnosis not present

## 2023-11-28 DIAGNOSIS — R519 Headache, unspecified: Secondary | ICD-10-CM | POA: Diagnosis not present

## 2023-12-12 DIAGNOSIS — R519 Headache, unspecified: Secondary | ICD-10-CM | POA: Diagnosis not present

## 2023-12-12 DIAGNOSIS — M9901 Segmental and somatic dysfunction of cervical region: Secondary | ICD-10-CM | POA: Diagnosis not present

## 2023-12-12 DIAGNOSIS — M5412 Radiculopathy, cervical region: Secondary | ICD-10-CM | POA: Diagnosis not present

## 2023-12-12 DIAGNOSIS — M9902 Segmental and somatic dysfunction of thoracic region: Secondary | ICD-10-CM | POA: Diagnosis not present

## 2023-12-26 DIAGNOSIS — R519 Headache, unspecified: Secondary | ICD-10-CM | POA: Diagnosis not present

## 2023-12-26 DIAGNOSIS — M9901 Segmental and somatic dysfunction of cervical region: Secondary | ICD-10-CM | POA: Diagnosis not present

## 2023-12-26 DIAGNOSIS — M9902 Segmental and somatic dysfunction of thoracic region: Secondary | ICD-10-CM | POA: Diagnosis not present

## 2023-12-26 DIAGNOSIS — M5412 Radiculopathy, cervical region: Secondary | ICD-10-CM | POA: Diagnosis not present

## 2023-12-26 DIAGNOSIS — S97121A Crushing injury of right lesser toe(s), initial encounter: Secondary | ICD-10-CM | POA: Diagnosis not present

## 2024-01-05 ENCOUNTER — Other Ambulatory Visit: Payer: Self-pay | Admitting: Family

## 2024-01-09 DIAGNOSIS — R519 Headache, unspecified: Secondary | ICD-10-CM | POA: Diagnosis not present

## 2024-01-09 DIAGNOSIS — M5412 Radiculopathy, cervical region: Secondary | ICD-10-CM | POA: Diagnosis not present

## 2024-01-09 DIAGNOSIS — M9901 Segmental and somatic dysfunction of cervical region: Secondary | ICD-10-CM | POA: Diagnosis not present

## 2024-01-09 DIAGNOSIS — M9902 Segmental and somatic dysfunction of thoracic region: Secondary | ICD-10-CM | POA: Diagnosis not present

## 2024-01-23 DIAGNOSIS — M9902 Segmental and somatic dysfunction of thoracic region: Secondary | ICD-10-CM | POA: Diagnosis not present

## 2024-01-23 DIAGNOSIS — M9901 Segmental and somatic dysfunction of cervical region: Secondary | ICD-10-CM | POA: Diagnosis not present

## 2024-01-23 DIAGNOSIS — M5412 Radiculopathy, cervical region: Secondary | ICD-10-CM | POA: Diagnosis not present

## 2024-01-23 DIAGNOSIS — R519 Headache, unspecified: Secondary | ICD-10-CM | POA: Diagnosis not present

## 2024-02-06 DIAGNOSIS — M9902 Segmental and somatic dysfunction of thoracic region: Secondary | ICD-10-CM | POA: Diagnosis not present

## 2024-02-06 DIAGNOSIS — R519 Headache, unspecified: Secondary | ICD-10-CM | POA: Diagnosis not present

## 2024-02-06 DIAGNOSIS — M5412 Radiculopathy, cervical region: Secondary | ICD-10-CM | POA: Diagnosis not present

## 2024-02-06 DIAGNOSIS — M9901 Segmental and somatic dysfunction of cervical region: Secondary | ICD-10-CM | POA: Diagnosis not present

## 2024-02-14 ENCOUNTER — Ambulatory Visit (INDEPENDENT_AMBULATORY_CARE_PROVIDER_SITE_OTHER): Admitting: Family

## 2024-02-14 ENCOUNTER — Encounter: Payer: Self-pay | Admitting: Family

## 2024-02-14 DIAGNOSIS — R7303 Prediabetes: Secondary | ICD-10-CM

## 2024-02-14 DIAGNOSIS — R5383 Other fatigue: Secondary | ICD-10-CM | POA: Diagnosis not present

## 2024-02-14 DIAGNOSIS — E66811 Obesity, class 1: Secondary | ICD-10-CM

## 2024-02-14 DIAGNOSIS — E559 Vitamin D deficiency, unspecified: Secondary | ICD-10-CM | POA: Diagnosis not present

## 2024-02-14 DIAGNOSIS — I10 Essential (primary) hypertension: Secondary | ICD-10-CM | POA: Diagnosis not present

## 2024-02-14 DIAGNOSIS — E538 Deficiency of other specified B group vitamins: Secondary | ICD-10-CM | POA: Diagnosis not present

## 2024-02-14 DIAGNOSIS — E782 Mixed hyperlipidemia: Secondary | ICD-10-CM

## 2024-02-14 MED ORDER — PHENTERMINE HCL 37.5 MG PO TABS: 37.5000 mg | ORAL_TABLET | Freq: Every day | ORAL | 2 refills | Status: DC

## 2024-02-14 NOTE — Assessment & Plan Note (Signed)
 A1C Continues to be in prediabetic ranges.  Will reassess at follow up after next lab check.  Patient counseled on dietary choices and verbalized understanding.   -CBC w/Diff -CMP w/eGFR -Hemoglobin A1C

## 2024-02-14 NOTE — Assessment & Plan Note (Signed)
 Checking labs today.  Continue current therapy for lipid control. Will modify as needed based on labwork results.   -CMP w/eGFR -Lipid Panel

## 2024-02-14 NOTE — Assessment & Plan Note (Addendum)
 Continue current meds.  Will adjust as needed based on results.  The patient is asked to make an attempt to improve diet and exercise patterns to aid in medical management of this problem. Addressed importance of increasing and maintaining water intake.

## 2024-02-14 NOTE — Assessment & Plan Note (Signed)
 Checking labs today.  Will continue supplements as needed.   - Vitamin D  - Vitamin B12 - TSH

## 2024-02-14 NOTE — Progress Notes (Signed)
 Established Patient Office Visit  Subjective:  Patient ID: Sherry Sanders, female    DOB: [birth year]  Age: 43 y.o. MRN: [id number]  Chief Complaint  Patient presents with   Follow-up    3 month follow up    Patient is here today for her 3 months follow up.  She has been feeling fairly well since last appointment.   She does have additional concerns to discuss today.  Doing well in general, asks if we can refill her phentermine .   Labs are due today.  She needs refills.   I have reviewed her active problem list, medication list, allergies, notes from last encounter, lab results for her appointment today.     No other concerns at this time.   Past Medical History:  Diagnosis Date   Fibroid     Past Surgical History:  Procedure Laterality Date   CESAREAN SECTION  2007    Social History   Socioeconomic History   Marital status: Significant Other    Spouse name: Not on file   Number of children: Not on file   Years of education: Not on file   Highest education level: Not on file  Occupational History   Not on file  Tobacco Use   Smoking status: Never   Smokeless tobacco: Never  Substance and Sexual Activity   Alcohol use: No   Drug use: No   Sexual activity: Yes    Birth control/protection: Pill  Other Topics Concern   Not on file  Social History Narrative   Not on file   Social Drivers of Health   Financial Resource Strain: Low Risk  (12/26/2023)   Received from [facility]   Overall Financial Resource Strain (CARDIA)    Difficulty of Paying Living Expenses: Not very hard  Food Insecurity: No Food Insecurity (12/26/2023)   Received from [facility]   Hunger Vital Sign    Within the past 12 months, you worried that your food would run out before you got the money to buy more.: Never true    Within the past 12 months, the food you bought just didn't last and you didn't have money to get more.: Never true   Transportation Needs: No Transportation Needs (12/26/2023)   Received from [facility] - Transportation    In the past 12 months, has lack of transportation kept you from medical appointments or from getting medications?: No    Lack of Transportation (Non-Medical): No  Physical Activity: Not on file  Stress: Not on file  Social Connections: Not on file  Intimate Partner Violence: Not on file    Family History  Problem Relation Age of Onset   Breast cancer Neg Hx     No Known Allergies  Review of Systems  All other systems reviewed and are negative.      Objective:   BP 118/76   Pulse 90   Ht 4' 10 (1.473 m)   Wt 159 lb 6.4 oz (72.3 kg)   SpO2 99%   BMI 33.31 kg/m   Vitals:   02/14/24 0859  BP: 118/76  Pulse: 90  Height: 4' 10 (1.473 m)  Weight: 159 lb 6.4 oz (72.3 kg)  SpO2: 99%  BMI (Calculated): 33.32    Physical Exam Vitals and nursing note reviewed.  Constitutional:      Appearance: Normal appearance. She is normal weight.  HENT:     Head: Normocephalic.  Eyes:  Extraocular Movements: Extraocular movements intact.     Conjunctiva/sclera: Conjunctivae normal.     Pupils: Pupils are equal, round, and reactive to light.  Cardiovascular:     Rate and Rhythm: Normal rate.  Pulmonary:     Effort: Pulmonary effort is normal.  Neurological:     General: No focal deficit present.     Mental Status: She is alert and oriented to person, place, and time. Mental status is at baseline.  Psychiatric:        Mood and Affect: Mood normal.        Behavior: Behavior normal.        Thought Content: Thought content normal.     No results found for any visits on 02/14/24.  No results found for this or any previous visit (from the past 2160 hours).     Assessment & Plan Obesity (BMI 30.0-34.9) Continue current meds.  Will adjust as needed based on results.  The patient is asked to make an attempt to improve diet and exercise  patterns to aid in medical management of this problem. Addressed importance of increasing and maintaining water intake.   Essential hypertension, benign Blood pressure well controlled with current medications.  Continue current therapy.  Will reassess at follow up.   - CBC w/Diff - CMP w/eGFR  Mixed hyperlipidemia Checking labs today.  Continue current therapy for lipid control. Will modify as needed based on labwork results.   -CMP w/eGFR -Lipid Panel  Vitamin D  deficiency, unspecified B12 deficiency due to diet Other fatigue Checking labs today.  Will continue supplements as needed.   - Vitamin D  - Vitamin B12 - TSH  Prediabetes A1C Continues to be in prediabetic ranges.  Will reassess at follow up after next lab check.  Patient counseled on dietary choices and verbalized understanding.   -CBC w/Diff -CMP w/eGFR -Hemoglobin A1C     Return in about 3 months (around 05/16/2024) for F/U.   Total time spent: 20 minutes  ALAN CHRISTELLA ARRANT, FNP  02/14/2024   This document may have been prepared by [facility] Voice Recognition software and as such may include unintentional dictation errors.

## 2024-02-15 DIAGNOSIS — R519 Headache, unspecified: Secondary | ICD-10-CM | POA: Diagnosis not present

## 2024-02-15 DIAGNOSIS — M9902 Segmental and somatic dysfunction of thoracic region: Secondary | ICD-10-CM | POA: Diagnosis not present

## 2024-02-15 DIAGNOSIS — M5412 Radiculopathy, cervical region: Secondary | ICD-10-CM | POA: Diagnosis not present

## 2024-02-15 DIAGNOSIS — M9901 Segmental and somatic dysfunction of cervical region: Secondary | ICD-10-CM | POA: Diagnosis not present

## 2024-02-15 LAB — TSH: TSH: 2.09 u[IU]/mL (ref 0.450–4.500)

## 2024-02-15 LAB — CMP14+EGFR
ALT: 15 IU/L (ref 0–32)
AST: 19 IU/L (ref 0–40)
Albumin: 4.5 g/dL (ref 3.9–4.9)
Alkaline Phosphatase: 65 IU/L (ref 44–121)
BUN/Creatinine Ratio: 11 (ref 9–23)
BUN: 11 mg/dL (ref 6–24)
Bilirubin Total: 0.4 mg/dL (ref 0.0–1.2)
CO2: 22 mmol/L (ref 20–29)
Calcium: 9.6 mg/dL (ref 8.7–10.2)
Chloride: 102 mmol/L (ref 96–106)
Creatinine, Ser: 1 mg/dL (ref 0.57–1.00)
Globulin, Total: 2.3 g/dL (ref 1.5–4.5)
Glucose: 79 mg/dL (ref 70–99)
Potassium: 4.3 mmol/L (ref 3.5–5.2)
Sodium: 137 mmol/L (ref 134–144)
Total Protein: 6.8 g/dL (ref 6.0–8.5)
eGFR: 71 mL/min/1.73 (ref >59)

## 2024-02-15 LAB — CBC WITH DIFFERENTIAL/PLATELET
Basophils Absolute: 0 x10E3/uL (ref 0.0–0.2)
Basos: 0 %
EOS (ABSOLUTE): 0.1 x10E3/uL (ref 0.0–0.4)
Eos: 1 %
Hematocrit: 42.9 % (ref 34.0–46.6)
Hemoglobin: 13.8 g/dL (ref 11.1–15.9)
Immature Grans (Abs): 0 x10E3/uL (ref 0.0–0.1)
Immature Granulocytes: 0 %
Lymphocytes Absolute: 2.2 x10E3/uL (ref 0.7–3.1)
Lymphs: 32 %
MCH: 30.1 pg (ref 26.6–33.0)
MCHC: 32.2 g/dL (ref 31.5–35.7)
MCV: 94 fL (ref 79–97)
Monocytes Absolute: 0.6 x10E3/uL (ref 0.1–0.9)
Monocytes: 9 %
Neutrophils Absolute: 4 x10E3/uL (ref 1.4–7.0)
Neutrophils: 58 %
Platelets: 259 x10E3/uL (ref 150–450)
RBC: 4.59 x10E6/uL (ref 3.77–5.28)
RDW: 12.5 % (ref 11.7–15.4)
WBC: 6.9 x10E3/uL (ref 3.4–10.8)

## 2024-02-15 LAB — VITAMIN D 25 HYDROXY (VIT D DEFICIENCY, FRACTURES): Vit D, 25-Hydroxy: 42.9 ng/mL (ref 30.0–100.0)

## 2024-02-15 LAB — VITAMIN B12: Vitamin B-12: 1458 pg/mL — ABNORMAL HIGH (ref 232–1245)

## 2024-02-15 LAB — LIPID PANEL
Chol/HDL Ratio: 2.3 ratio (ref 0.0–4.4)
Cholesterol, Total: 117 mg/dL (ref 100–199)
HDL: 52 mg/dL (ref >39)
LDL Chol Calc (NIH): 55 mg/dL (ref 0–99)
Triglycerides: 35 mg/dL (ref 0–149)
VLDL Cholesterol Cal: 10 mg/dL (ref 5–40)

## 2024-02-15 LAB — HEMOGLOBIN A1C
Est. average glucose Bld gHb Est-mCnc: 114 mg/dL
Hgb A1c MFr Bld: 5.6 % (ref 4.8–5.6)

## 2024-02-27 DIAGNOSIS — R519 Headache, unspecified: Secondary | ICD-10-CM | POA: Diagnosis not present

## 2024-02-27 DIAGNOSIS — M9902 Segmental and somatic dysfunction of thoracic region: Secondary | ICD-10-CM | POA: Diagnosis not present

## 2024-02-27 DIAGNOSIS — M5412 Radiculopathy, cervical region: Secondary | ICD-10-CM | POA: Diagnosis not present

## 2024-02-27 DIAGNOSIS — M9901 Segmental and somatic dysfunction of cervical region: Secondary | ICD-10-CM | POA: Diagnosis not present

## 2024-03-12 DIAGNOSIS — R519 Headache, unspecified: Secondary | ICD-10-CM | POA: Diagnosis not present

## 2024-03-12 DIAGNOSIS — M9902 Segmental and somatic dysfunction of thoracic region: Secondary | ICD-10-CM | POA: Diagnosis not present

## 2024-03-12 DIAGNOSIS — M5412 Radiculopathy, cervical region: Secondary | ICD-10-CM | POA: Diagnosis not present

## 2024-03-12 DIAGNOSIS — M9901 Segmental and somatic dysfunction of cervical region: Secondary | ICD-10-CM | POA: Diagnosis not present

## 2024-03-25 DIAGNOSIS — J301 Allergic rhinitis due to pollen: Secondary | ICD-10-CM | POA: Diagnosis not present

## 2024-03-26 DIAGNOSIS — M5412 Radiculopathy, cervical region: Secondary | ICD-10-CM | POA: Diagnosis not present

## 2024-03-26 DIAGNOSIS — M9902 Segmental and somatic dysfunction of thoracic region: Secondary | ICD-10-CM | POA: Diagnosis not present

## 2024-03-26 DIAGNOSIS — R519 Headache, unspecified: Secondary | ICD-10-CM | POA: Diagnosis not present

## 2024-03-26 DIAGNOSIS — M9901 Segmental and somatic dysfunction of cervical region: Secondary | ICD-10-CM | POA: Diagnosis not present

## 2024-04-09 DIAGNOSIS — M5412 Radiculopathy, cervical region: Secondary | ICD-10-CM | POA: Diagnosis not present

## 2024-04-09 DIAGNOSIS — M9901 Segmental and somatic dysfunction of cervical region: Secondary | ICD-10-CM | POA: Diagnosis not present

## 2024-04-09 DIAGNOSIS — M9902 Segmental and somatic dysfunction of thoracic region: Secondary | ICD-10-CM | POA: Diagnosis not present

## 2024-04-09 DIAGNOSIS — R519 Headache, unspecified: Secondary | ICD-10-CM | POA: Diagnosis not present

## 2024-04-23 DIAGNOSIS — R519 Headache, unspecified: Secondary | ICD-10-CM | POA: Diagnosis not present

## 2024-04-23 DIAGNOSIS — M5412 Radiculopathy, cervical region: Secondary | ICD-10-CM | POA: Diagnosis not present

## 2024-04-23 DIAGNOSIS — M9901 Segmental and somatic dysfunction of cervical region: Secondary | ICD-10-CM | POA: Diagnosis not present

## 2024-04-23 DIAGNOSIS — M9902 Segmental and somatic dysfunction of thoracic region: Secondary | ICD-10-CM | POA: Diagnosis not present

## 2024-04-30 ENCOUNTER — Other Ambulatory Visit: Payer: Self-pay | Admitting: Family

## 2024-04-30 DIAGNOSIS — Z1231 Encounter for screening mammogram for malignant neoplasm of breast: Secondary | ICD-10-CM

## 2024-05-07 DIAGNOSIS — R519 Headache, unspecified: Secondary | ICD-10-CM | POA: Diagnosis not present

## 2024-05-07 DIAGNOSIS — M9902 Segmental and somatic dysfunction of thoracic region: Secondary | ICD-10-CM | POA: Diagnosis not present

## 2024-05-07 DIAGNOSIS — M5412 Radiculopathy, cervical region: Secondary | ICD-10-CM | POA: Diagnosis not present

## 2024-05-07 DIAGNOSIS — M9901 Segmental and somatic dysfunction of cervical region: Secondary | ICD-10-CM | POA: Diagnosis not present

## 2024-05-14 ENCOUNTER — Encounter

## 2024-05-21 ENCOUNTER — Encounter: Payer: Self-pay | Admitting: Family

## 2024-05-21 ENCOUNTER — Ambulatory Visit (INDEPENDENT_AMBULATORY_CARE_PROVIDER_SITE_OTHER): Admitting: Family

## 2024-05-21 VITALS — BP 114/76 | HR 79 | Ht <= 58 in | Wt 160.8 lb

## 2024-05-21 DIAGNOSIS — E782 Mixed hyperlipidemia: Secondary | ICD-10-CM

## 2024-05-21 DIAGNOSIS — M5412 Radiculopathy, cervical region: Secondary | ICD-10-CM | POA: Diagnosis not present

## 2024-05-21 DIAGNOSIS — M9902 Segmental and somatic dysfunction of thoracic region: Secondary | ICD-10-CM | POA: Diagnosis not present

## 2024-05-21 DIAGNOSIS — E6609 Other obesity due to excess calories: Secondary | ICD-10-CM

## 2024-05-21 DIAGNOSIS — Z1211 Encounter for screening for malignant neoplasm of colon: Secondary | ICD-10-CM | POA: Insufficient documentation

## 2024-05-21 DIAGNOSIS — Z1231 Encounter for screening mammogram for malignant neoplasm of breast: Secondary | ICD-10-CM | POA: Insufficient documentation

## 2024-05-21 DIAGNOSIS — R5383 Other fatigue: Secondary | ICD-10-CM | POA: Diagnosis not present

## 2024-05-21 DIAGNOSIS — R7303 Prediabetes: Secondary | ICD-10-CM

## 2024-05-21 DIAGNOSIS — E66811 Obesity, class 1: Secondary | ICD-10-CM | POA: Diagnosis not present

## 2024-05-21 DIAGNOSIS — I10 Essential (primary) hypertension: Secondary | ICD-10-CM | POA: Diagnosis not present

## 2024-05-21 DIAGNOSIS — Z6833 Body mass index (BMI) 33.0-33.9, adult: Secondary | ICD-10-CM

## 2024-05-21 DIAGNOSIS — E559 Vitamin D deficiency, unspecified: Secondary | ICD-10-CM

## 2024-05-21 DIAGNOSIS — R519 Headache, unspecified: Secondary | ICD-10-CM | POA: Diagnosis not present

## 2024-05-21 DIAGNOSIS — E538 Deficiency of other specified B group vitamins: Secondary | ICD-10-CM

## 2024-05-21 DIAGNOSIS — M9901 Segmental and somatic dysfunction of cervical region: Secondary | ICD-10-CM | POA: Diagnosis not present

## 2024-05-21 LAB — POC CREATINE & ALBUMIN,URINE
Albumin/Creatinine Ratio, Urine, POC: <30
Creatinine, POC: 200 mg/dL
Microalbumin Ur, POC: 30 mg/L

## 2024-05-21 MED ORDER — PHENTERMINE HCL 37.5 MG PO TABS: 37.5000 mg | ORAL_TABLET | Freq: Every day | ORAL | 2 refills | Status: AC

## 2024-05-21 NOTE — Assessment & Plan Note (Addendum)
-   Continue healthy diet and exercise as tolerated. - Continue medications as prescribed. - Check labs today

## 2024-05-21 NOTE — Assessment & Plan Note (Addendum)
-   Check labs today - Supplementation recommended based off lab results and will notify patient at that time

## 2024-05-21 NOTE — Assessment & Plan Note (Addendum)
-   GI referral sent to begin colonoscopy screenings - Patient to reschedule missed mammogram appointment.

## 2024-05-21 NOTE — Progress Notes (Signed)
 Established Patient Office Visit  Subjective:  Patient ID: Sherry Sanders, female    DOB: May 30, 1981  Age: 43 y.o. MRN: [id number]  Chief Complaint  Patient presents with   Follow-up    3 month follow up, needs ckd    Patient is here today for her 3 months follow up.  She has been feeling fairly well since last appointment.   She does have additional concerns to discuss today. She has concerns of her weight at this time. She has been given Phentermine  in the past but reports not taking it daily. The last prescription was filled 02/2024. Discussed restarting phentermine  and daily adherence for it to help with weight loss in addition to making healthier food choices. Will send refill for Phentermine  and FU in 1 month for weight check in. Labs are due today but she is not fasting and will return tomorrow to get labs completed. She needs refills.   I have reviewed her active problem list, medication list, allergies, family history, social history, health maintenance, notes from last encounter, lab results for her appointment today.    Her mammogram was scheduled on 05/14/24 and she missed her appointment but will get it rescheduled. GI referral sent for screening colonoscopy.  Patient declines flu shot at this time.    No other concerns at this time.   Past Medical History:  Diagnosis Date   Fibroid     Past Surgical History:  Procedure Laterality Date   CESAREAN SECTION  2007    Social History   Socioeconomic History   Marital status: Significant Other    Spouse name: Not on file   Number of children: Not on file   Years of education: Not on file   Highest education level: Not on file  Occupational History   Not on file  Tobacco Use   Smoking status: Never   Smokeless tobacco: Never  Substance and Sexual Activity   Alcohol use: No   Drug use: No   Sexual activity: Yes    Birth control/protection: Pill  Other Topics Concern   Not on file  Social History  Narrative   Not on file   Social Drivers of Health   Financial Resource Strain: Low Risk  (12/26/2023)   Received from [facility]   Overall Financial Resource Strain (CARDIA)    Difficulty of Paying Living Expenses: Not very hard  Food Insecurity: No Food Insecurity (12/26/2023)   Received from [facility]   Hunger Vital Sign    Within the past 12 months, you worried that your food would run out before you got the money to buy more.: Never true    Within the past 12 months, the food you bought just didn't last and you didn't have money to get more.: Never true  Transportation Needs: No Transportation Needs (12/26/2023)   Received from [facility]    In the past 12 months, has lack of transportation kept you from medical appointments or from getting medications?: No    Lack of Transportation (Non-Medical): No  Physical Activity: Not on file  Stress: Not on file  Social Connections: Not on file  Intimate Partner Violence: Not on file    Family History  Problem Relation Age of Onset   Breast cancer Neg Hx     No Known Allergies  Review of Systems  Constitutional:  Negative for malaise/fatigue.  HENT: Negative.    Eyes:  Negative  for blurred vision and pain.  Respiratory:  Negative for cough and shortness of breath.   Cardiovascular:  Negative for chest pain, palpitations, claudication and leg swelling.  Gastrointestinal:  Negative for abdominal pain, blood in stool, constipation, diarrhea, nausea and vomiting.  Genitourinary:  Negative for dysuria, frequency and urgency.  Musculoskeletal: Negative.   Skin: Negative.   Neurological:  Negative for dizziness, tingling, sensory change and headaches.  Endo/Heme/Allergies: Negative.   Psychiatric/Behavioral: Negative.         Objective:   BP 114/76   Pulse 79   Ht 4' 10 (1.473 m)   Wt 160 lb 12.8 oz (72.9 kg)   SpO2 98%   BMI 33.61 kg/m    Vitals:   05/21/24 0854  BP: 114/76  Pulse: 79  Height: 4' 10 (1.473 m)  Weight: 160 lb 12.8 oz (72.9 kg)  SpO2: 98%  BMI (Calculated): 33.62    Physical Exam Vitals and nursing note reviewed.  Constitutional:      Appearance: Normal appearance.  HENT:     Head: Normocephalic.  Eyes:     Extraocular Movements: Extraocular movements intact.     Pupils: Pupils are equal, round, and reactive to light.  Cardiovascular:     Rate and Rhythm: Normal rate and regular rhythm.     Pulses: Normal pulses.     Heart sounds: Normal heart sounds. No murmur heard. Pulmonary:     Effort: Pulmonary effort is normal. No respiratory distress.     Breath sounds: Normal breath sounds.  Abdominal:     General: There is no distension.     Tenderness: There is no abdominal tenderness.  Musculoskeletal:        General: No tenderness. Normal range of motion.     Cervical back: Normal range of motion and neck supple.     Right lower leg: No edema.     Left lower leg: No edema.  Skin:    General: Skin is warm and dry.     Coloration: Skin is not jaundiced.     Findings: No erythema.  Neurological:     General: No focal deficit present.     Mental Status: She is alert and oriented to person, place, and time.  Psychiatric:        Mood and Affect: Mood normal.        Speech: Speech normal.        Behavior: Behavior is cooperative.        Cognition and Memory: Memory is not impaired.      Results for orders placed or performed in visit on 05/21/24  POC CREATINE & ALBUMIN,URINE  Result Value Ref Range   Microalbumin Ur, POC 30 mg/L   Creatinine, POC 200 mg/dL   Albumin/Creatinine Ratio, Urine, POC <30     Recent Results (from the past 2160 hours)  POC CREATINE & ALBUMIN,URINE     Status: Normal   Collection Time: 05/21/24  9:04 AM  Result Value Ref Range   Microalbumin Ur, POC 30 mg/L   Creatinine, POC 200 mg/dL   Albumin/Creatinine Ratio, Urine, POC <30        Assessment &  Plan Essential hypertension, benign Prediabetes Class 1 obesity due to excess calories without serious comorbidity with body mass index (BMI) of 33.0 to 33.9 in adult Mixed hyperlipidemia - Continue healthy diet and exercise as tolerated. - Continue medications as prescribed. - Check labs today   Other fatigue Vitamin D  deficiency, unspecified B12 deficiency due to  diet - Check labs today - Supplementation recommended based off lab results and will notify patient at that time   Screening mammogram for breast cancer Encounter for screening colonoscopy - GI referral sent to begin colonoscopy screenings - Patient to reschedule missed mammogram appointment.    Return in about 4 weeks (around 06/18/2024).   Total time spent: 20 minutes  Oddis DELENA Cain, FNP  05/21/2024   This document may have been prepared by [facility] Voice Recognition software and as such may include unintentional dictation errors.

## 2024-05-22 ENCOUNTER — Ambulatory Visit
Admission: RE | Admit: 2024-05-22 | Discharge: 2024-05-22 | Disposition: A | Discharge status: Home / Self Care | Source: Ambulatory Visit | Attending: Family | Admitting: Family

## 2024-05-22 ENCOUNTER — Other Ambulatory Visit

## 2024-05-22 DIAGNOSIS — E538 Deficiency of other specified B group vitamins: Secondary | ICD-10-CM | POA: Diagnosis not present

## 2024-05-22 DIAGNOSIS — R5383 Other fatigue: Secondary | ICD-10-CM | POA: Diagnosis not present

## 2024-05-22 DIAGNOSIS — E782 Mixed hyperlipidemia: Secondary | ICD-10-CM | POA: Diagnosis not present

## 2024-05-22 DIAGNOSIS — R7303 Prediabetes: Secondary | ICD-10-CM | POA: Diagnosis not present

## 2024-05-22 DIAGNOSIS — E6609 Other obesity due to excess calories: Secondary | ICD-10-CM | POA: Diagnosis not present

## 2024-05-22 DIAGNOSIS — E66811 Obesity, class 1: Secondary | ICD-10-CM

## 2024-05-22 DIAGNOSIS — I10 Essential (primary) hypertension: Secondary | ICD-10-CM | POA: Diagnosis not present

## 2024-05-22 DIAGNOSIS — E559 Vitamin D deficiency, unspecified: Secondary | ICD-10-CM

## 2024-05-22 DIAGNOSIS — Z6833 Body mass index (BMI) 33.0-33.9, adult: Secondary | ICD-10-CM | POA: Diagnosis not present

## 2024-05-22 DIAGNOSIS — Z1231 Encounter for screening mammogram for malignant neoplasm of breast: Secondary | ICD-10-CM | POA: Insufficient documentation

## 2024-05-23 ENCOUNTER — Ambulatory Visit: Payer: Self-pay

## 2024-05-23 LAB — CBC WITH DIFFERENTIAL/PLATELET
Basophils Absolute: 0 x10E3/uL (ref 0.0–0.2)
Basos: 0 %
EOS (ABSOLUTE): 0.2 x10E3/uL (ref 0.0–0.4)
Eos: 2 %
Hematocrit: 41.7 % (ref 34.0–46.6)
Hemoglobin: 13.6 g/dL (ref 11.1–15.9)
Immature Grans (Abs): 0 x10E3/uL (ref 0.0–0.1)
Immature Granulocytes: 0 %
Lymphocytes Absolute: 2.3 x10E3/uL (ref 0.7–3.1)
Lymphs: 31 %
MCH: 30.3 pg (ref 26.6–33.0)
MCHC: 32.6 g/dL (ref 31.5–35.7)
MCV: 93 fL (ref 79–97)
Monocytes Absolute: 0.5 x10E3/uL (ref 0.1–0.9)
Monocytes: 7 %
Neutrophils Absolute: 4.4 x10E3/uL (ref 1.4–7.0)
Neutrophils: 60 %
Platelets: 249 x10E3/uL (ref 150–450)
RBC: 4.49 x10E6/uL (ref 3.77–5.28)
RDW: 12.2 % (ref 11.7–15.4)
WBC: 7.5 x10E3/uL (ref 3.4–10.8)

## 2024-05-23 LAB — CMP14+EGFR
ALT: 13 IU/L (ref 0–32)
AST: 16 IU/L (ref 0–40)
Albumin: 4.3 g/dL (ref 3.9–4.9)
Alkaline Phosphatase: 68 IU/L (ref 41–116)
BUN/Creatinine Ratio: 13 (ref 9–23)
BUN: 13 mg/dL (ref 6–24)
Bilirubin Total: 0.4 mg/dL (ref 0.0–1.2)
CO2: 21 mmol/L (ref 20–29)
Calcium: 9.1 mg/dL (ref 8.7–10.2)
Chloride: 102 mmol/L (ref 96–106)
Creatinine, Ser: 1 mg/dL (ref 0.57–1.00)
Globulin, Total: 2.2 g/dL (ref 1.5–4.5)
Glucose: 88 mg/dL (ref 70–99)
Potassium: 4.3 mmol/L (ref 3.5–5.2)
Sodium: 139 mmol/L (ref 134–144)
Total Protein: 6.5 g/dL (ref 6.0–8.5)
eGFR: 71 mL/min/1.73 (ref >59)

## 2024-05-23 LAB — LIPID PANEL
Chol/HDL Ratio: 2.5 ratio (ref 0.0–4.4)
Cholesterol, Total: 122 mg/dL (ref 100–199)
HDL: 48 mg/dL (ref >39)
LDL Chol Calc (NIH): 64 mg/dL (ref 0–99)
Triglycerides: 37 mg/dL (ref 0–149)
VLDL Cholesterol Cal: 10 mg/dL (ref 5–40)

## 2024-05-23 LAB — HEMOGLOBIN A1C
Est. average glucose Bld gHb Est-mCnc: 120 mg/dL
Hgb A1c MFr Bld: 5.8 % — ABNORMAL HIGH (ref 4.8–5.6)

## 2024-05-23 LAB — TSH: TSH: 1.82 u[IU]/mL (ref 0.450–4.500)

## 2024-05-23 LAB — VITAMIN B12: Vitamin B-12: 1177 pg/mL (ref 232–1245)

## 2024-05-23 LAB — VITAMIN D 25 HYDROXY (VIT D DEFICIENCY, FRACTURES): Vit D, 25-Hydroxy: 56.2 ng/mL (ref 30.0–100.0)

## 2024-05-27 NOTE — Progress Notes (Signed)
 Patient notified

## 2024-06-04 DIAGNOSIS — M5412 Radiculopathy, cervical region: Secondary | ICD-10-CM | POA: Diagnosis not present

## 2024-06-04 DIAGNOSIS — M9901 Segmental and somatic dysfunction of cervical region: Secondary | ICD-10-CM | POA: Diagnosis not present

## 2024-06-04 DIAGNOSIS — M9902 Segmental and somatic dysfunction of thoracic region: Secondary | ICD-10-CM | POA: Diagnosis not present

## 2024-06-04 DIAGNOSIS — R519 Headache, unspecified: Secondary | ICD-10-CM | POA: Diagnosis not present

## 2024-06-17 DIAGNOSIS — M9901 Segmental and somatic dysfunction of cervical region: Secondary | ICD-10-CM | POA: Diagnosis not present

## 2024-06-17 DIAGNOSIS — M9902 Segmental and somatic dysfunction of thoracic region: Secondary | ICD-10-CM | POA: Diagnosis not present

## 2024-06-17 DIAGNOSIS — M5412 Radiculopathy, cervical region: Secondary | ICD-10-CM | POA: Diagnosis not present

## 2024-06-17 DIAGNOSIS — R519 Headache, unspecified: Secondary | ICD-10-CM | POA: Diagnosis not present

## 2024-06-18 ENCOUNTER — Encounter: Payer: Self-pay | Admitting: Family

## 2024-06-18 ENCOUNTER — Ambulatory Visit (INDEPENDENT_AMBULATORY_CARE_PROVIDER_SITE_OTHER): Admitting: Family

## 2024-06-18 VITALS — BP 112/82 | HR 84 | Ht <= 58 in | Wt 163.2 lb

## 2024-06-18 DIAGNOSIS — I10 Essential (primary) hypertension: Secondary | ICD-10-CM | POA: Diagnosis not present

## 2024-06-18 DIAGNOSIS — Z6832 Body mass index (BMI) 32.0-32.9, adult: Secondary | ICD-10-CM

## 2024-06-18 DIAGNOSIS — R7303 Prediabetes: Secondary | ICD-10-CM | POA: Diagnosis not present

## 2024-06-18 DIAGNOSIS — E66811 Obesity, class 1: Secondary | ICD-10-CM | POA: Diagnosis not present

## 2024-06-18 DIAGNOSIS — E782 Mixed hyperlipidemia: Secondary | ICD-10-CM | POA: Diagnosis not present

## 2024-06-18 NOTE — Assessment & Plan Note (Signed)
-   Continue healthy diet and exercise as tolerated. - Continue medications as prescribed. - Discussed labs in detail today. - Stop phentermine

## 2024-06-18 NOTE — Progress Notes (Signed)
 Established Patient Office Visit  Subjective:  Patient ID: Sherry Sanders, female    DOB: [birth year]  Age: 43 y.o. MRN: [id number]  Chief Complaint  Patient presents with   Follow-up    4 week follow up    Patient is here today for her 1 month follow up.  She has been feeling well since last appointment.   She does have additional concerns to discuss today. She has gained a few pounds since her last visit. She reports taking her phentermine  as prescribed but reports decreased exercise frequency lately. Labs were done prior to appointment, will discuss in detail today. Reinforced healthy diet and exercise as tolerated to promote weight loss. Will stop Phentermine  at this time since patient has not had weight loss while taking this medication.  I have reviewed her active problem list, medication list, allergies, family history, social history, health maintenance, notes from last encounter, lab results for her appointment today.    Her mammogram was completed and normal. Her colonoscopy is scheduled for Monday.     No other concerns at this time.   Past Medical History:  Diagnosis Date   Fibroid     Past Surgical History:  Procedure Laterality Date   CESAREAN SECTION  2007    Social History   Socioeconomic History   Marital status: Significant Other    Spouse name: Not on file   Number of children: Not on file   Years of education: Not on file   Highest education level: Not on file  Occupational History   Not on file  Tobacco Use   Smoking status: Never   Smokeless tobacco: Never  Substance and Sexual Activity   Alcohol use: No   Drug use: No   Sexual activity: Yes    Birth control/protection: Pill  Other Topics Concern   Not on file  Social History Narrative   Not on file   Social Drivers of Health   Financial Resource Strain: Low Risk  (12/26/2023)   Received from [facility]   Overall Financial Resource Strain (CARDIA)     Difficulty of Paying Living Expenses: Not very hard  Food Insecurity: No Food Insecurity (12/26/2023)   Received from [facility]   Hunger Vital Sign    Within the past 12 months, you worried that your food would run out before you got the money to buy more.: Never true    Within the past 12 months, the food you bought just didn't last and you didn't have money to get more.: Never true  Transportation Needs: No Transportation Needs (12/26/2023)   Received from [facility]    In the past 12 months, has lack of transportation kept you from medical appointments or from getting medications?: No    Lack of Transportation (Non-Medical): No  Physical Activity: Not on file  Stress: Not on file  Social Connections: Not on file  Intimate Partner Violence: Not on file    Family History  Problem Relation Age of Onset   Breast cancer Mother     No Known Allergies  Review of Systems  Constitutional:  Negative for malaise/fatigue.  HENT: Negative.    Eyes:  Negative for blurred vision and pain.  Respiratory:  Negative for cough and shortness of breath.   Cardiovascular:  Negative for chest pain, palpitations, claudication and leg swelling.  Gastrointestinal:  Negative for abdominal pain, blood in stool, constipation, diarrhea, nausea and vomiting.  Genitourinary:  Negative for dysuria, frequency and urgency.  Musculoskeletal: Negative.   Skin: Negative.   Neurological:  Negative for dizziness, tingling, sensory change and headaches.  Endo/Heme/Allergies: Negative.   Psychiatric/Behavioral: Negative.         Objective:   BP 112/82   Pulse 84   Ht 4' 10 (1.473 m)   Wt 163 lb 3.2 oz (74 kg)   SpO2 99%   BMI 34.11 kg/m   Vitals:   06/18/24 0918  BP: 112/82  Pulse: 84  Height: 4' 10 (1.473 m)  Weight: 163 lb 3.2 oz (74 kg)  SpO2: 99%  BMI (Calculated): 34.12    Physical Exam Vitals and nursing note reviewed.   Constitutional:      Appearance: Normal appearance.  HENT:     Head: Normocephalic.  Eyes:     Extraocular Movements: Extraocular movements intact.     Pupils: Pupils are equal, round, and reactive to light.  Cardiovascular:     Rate and Rhythm: Normal rate and regular rhythm.     Pulses: Normal pulses.     Heart sounds: Normal heart sounds. No murmur heard. Pulmonary:     Effort: Pulmonary effort is normal. No respiratory distress.     Breath sounds: Normal breath sounds.  Abdominal:     General: There is no distension.     Tenderness: There is no abdominal tenderness.  Musculoskeletal:        General: No tenderness. Normal range of motion.     Cervical back: Normal range of motion and neck supple.     Right lower leg: No edema.     Left lower leg: No edema.  Skin:    General: Skin is warm and dry.     Coloration: Skin is not jaundiced.     Findings: No erythema.  Neurological:     General: No focal deficit present.     Mental Status: She is alert and oriented to person, place, and time.  Psychiatric:        Mood and Affect: Mood normal.        Speech: Speech normal.        Behavior: Behavior is cooperative.        Cognition and Memory: Memory is not impaired.      No results found for any visits on 06/18/24.  Recent Results (from the past 2160 hours)  POC CREATINE & ALBUMIN,URINE     Status: Normal   Collection Time: 05/21/24  9:04 AM  Result Value Ref Range   Microalbumin Ur, POC 30 mg/L   Creatinine, POC 200 mg/dL   Albumin/Creatinine Ratio, Urine, POC <30   TSH     Status: None   Collection Time: 05/22/24 11:23 AM  Result Value Ref Range   TSH 1.820 0.450 - 4.500 uIU/mL  Hemoglobin A1c     Status: Abnormal   Collection Time: 05/22/24 11:23 AM  Result Value Ref Range   Hgb A1c MFr Bld 5.8 (H) 4.8 - 5.6 %    Comment:          Prediabetes: 5.7 - 6.4          Diabetes: >6.4          Glycemic control for adults with diabetes: <7.0    Est. average glucose  Bld gHb Est-mCnc 120 mg/dL  CBC with Diff     Status: None   Collection Time: 05/22/24 11:23 AM  Result Value Ref Range   WBC 7.5 3.4 -  10.8 x10E3/uL   RBC 4.49 3.77 - 5.28 x10E6/uL   Hemoglobin 13.6 11.1 - 15.9 g/dL   Hematocrit 58.2 65.9 - 46.6 %   MCV 93 79 - 97 fL   MCH 30.3 26.6 - 33.0 pg   MCHC 32.6 31.5 - 35.7 g/dL   RDW 87.7 88.2 - 84.5 %   Platelets 249 150 - 450 x10E3/uL   Neutrophils 60 Not Estab. %   Lymphs 31 Not Estab. %   Monocytes 7 Not Estab. %   Eos 2 Not Estab. %   Basos 0 Not Estab. %   Neutrophils Absolute 4.4 1.4 - 7.0 x10E3/uL   Lymphocytes Absolute 2.3 0.7 - 3.1 x10E3/uL   Monocytes Absolute 0.5 0.1 - 0.9 x10E3/uL   EOS (ABSOLUTE) 0.2 0.0 - 0.4 x10E3/uL   Basophils Absolute 0.0 0.0 - 0.2 x10E3/uL   Immature Granulocytes 0 Not Estab. %   Immature Grans (Abs) 0.0 0.0 - 0.1 x10E3/uL  Vitamin B12     Status: None   Collection Time: 05/22/24 11:23 AM  Result Value Ref Range   Vitamin B-12 1,177 232 - 1,245 pg/mL  Vitamin D  (25 hydroxy)     Status: None   Collection Time: 05/22/24 11:23 AM  Result Value Ref Range   Vit D, 25-Hydroxy 56.2 30.0 - 100.0 ng/mL    Comment: Vitamin D  deficiency has been defined by the Institute of Medicine and an Endocrine Society practice guideline as a level of serum 25-OH vitamin D  less than 20 ng/mL (1,2). The Endocrine Society went on to further define vitamin D  insufficiency as a level between 21 and 29 ng/mL (2). 1. IOM (Institute of Medicine). 2010. Dietary reference    intakes for calcium and D. [place]: The    [employer]. 2. Holick MF, [place], Bischoff-Ferrari HA, et al.    Evaluation, treatment, and prevention of vitamin D     deficiency: an Endocrine Society clinical practice    guideline. JCEM. 2011 Jul; 96(7):1911-30.   Lipid panel     Status: None   Collection Time: 05/22/24 11:23 AM  Result Value Ref Range   Cholesterol, Total 122 100 - 199 mg/dL   Triglycerides 37 0 - 149 mg/dL    HDL 48 >60 mg/dL   VLDL Cholesterol Cal 10 5 - 40 mg/dL   LDL Chol Calc (NIH) 64 0 - 99 mg/dL   Chol/HDL Ratio 2.5 0.0 - 4.4 ratio    Comment:                                   T. Chol/HDL Ratio                                             Men  Women                               1/2 Avg.Risk  3.4    3.3                                   Avg.Risk  5.0    4.4  2X Avg.Risk  9.6    7.1                                3X Avg.Risk 23.4   11.0   CMP14+EGFR     Status: None   Collection Time: 05/22/24 11:23 AM  Result Value Ref Range   Glucose 88 70 - 99 mg/dL   BUN 13 6 - 24 mg/dL   Creatinine, Ser 8.99 0.57 - 1.00 mg/dL   eGFR 71 >40 fO/fpw/8.26   BUN/Creatinine Ratio 13 9 - 23   Sodium 139 134 - 144 mmol/L   Potassium 4.3 3.5 - 5.2 mmol/L   Chloride 102 96 - 106 mmol/L   CO2 21 20 - 29 mmol/L   Calcium 9.1 8.7 - 10.2 mg/dL   Total Protein 6.5 6.0 - 8.5 g/dL   Albumin 4.3 3.9 - 4.9 g/dL   Globulin, Total 2.2 1.5 - 4.5 g/dL   Bilirubin Total 0.4 0.0 - 1.2 mg/dL   Alkaline Phosphatase 68 41 - 116 IU/L   AST 16 0 - 40 IU/L   ALT 13 0 - 32 IU/L       Assessment & Plan:   Assessment & Plan Essential hypertension, benign Class 1 obesity with serious comorbidity and body mass index (BMI) of 32.0 to 32.9 in adult, unspecified obesity type Mixed hyperlipidemia Prediabetes - Continue healthy diet and exercise as tolerated. - Continue medications as prescribed. - Discussed labs in detail today. - Stop phentermine      No follow-ups on file.   Total time spent: 20 minutes  Oddis DELENA Cain, FNP  06/18/2024   This document may have been prepared by [facility] Voice Recognition software and as such may include unintentional dictation errors.

## 2024-06-23 ENCOUNTER — Ambulatory Visit
Admission: RE | Admit: 2024-06-23 | Discharge: 2024-06-23 | Disposition: A | Attending: Gastroenterology | Admitting: Gastroenterology

## 2024-06-23 ENCOUNTER — Ambulatory Visit: Admitting: Anesthesiology

## 2024-06-23 ENCOUNTER — Encounter: Admission: RE | Disposition: A | Payer: Self-pay | Attending: Gastroenterology

## 2024-06-23 ENCOUNTER — Encounter: Payer: Self-pay | Admitting: Gastroenterology

## 2024-06-23 DIAGNOSIS — Z6832 Body mass index (BMI) 32.0-32.9, adult: Secondary | ICD-10-CM | POA: Insufficient documentation

## 2024-06-23 DIAGNOSIS — I10 Essential (primary) hypertension: Secondary | ICD-10-CM | POA: Insufficient documentation

## 2024-06-23 DIAGNOSIS — E782 Mixed hyperlipidemia: Secondary | ICD-10-CM | POA: Diagnosis not present

## 2024-06-23 DIAGNOSIS — E66813 Obesity, class 3: Secondary | ICD-10-CM | POA: Insufficient documentation

## 2024-06-23 DIAGNOSIS — K621 Rectal polyp: Secondary | ICD-10-CM | POA: Insufficient documentation

## 2024-06-23 DIAGNOSIS — Z1211 Encounter for screening for malignant neoplasm of colon: Secondary | ICD-10-CM | POA: Insufficient documentation

## 2024-06-23 HISTORY — PX: COLONOSCOPY: SHX5424

## 2024-06-23 HISTORY — PX: POLYPECTOMY: SHX149

## 2024-06-23 LAB — POCT PREGNANCY, URINE: Preg Test, Ur: NEGATIVE

## 2024-06-23 SURGERY — COLONOSCOPY
Anesthesia: General

## 2024-06-23 MED ORDER — PROPOFOL 500 MG/50ML IV EMUL
INTRAVENOUS | Status: DC | PRN
  Administered 2024-06-23: 11:00:00 75 ug/kg/min via INTRAVENOUS

## 2024-06-23 MED ORDER — DEXMEDETOMIDINE HCL IN NACL 80 MCG/20ML IV SOLN
INTRAVENOUS | Status: DC | PRN
  Administered 2024-06-23: 11:00:00 8 ug via INTRAVENOUS
  Administered 2024-06-23: 11:00:00 12 ug via INTRAVENOUS

## 2024-06-23 MED ORDER — SODIUM CHLORIDE 0.9 % IV SOLN
INTRAVENOUS | Status: DC
  Administered 2024-06-23: 11:00:00 20 mL/h via INTRAVENOUS

## 2024-06-23 MED ORDER — LIDOCAINE HCL (PF) 2 % IJ SOLN
INTRAMUSCULAR | Status: AC
  Filled 2024-06-23: qty 5

## 2024-06-23 MED ORDER — PROPOFOL 1000 MG/100ML IV EMUL
INTRAVENOUS | Status: AC
  Filled 2024-06-23: qty 100

## 2024-06-23 MED ORDER — LIDOCAINE HCL (CARDIAC) PF 100 MG/5ML IV SOSY
PREFILLED_SYRINGE | INTRAVENOUS | Status: DC | PRN
  Administered 2024-06-23: 11:00:00 60 mg via INTRAVENOUS

## 2024-06-23 MED ORDER — PROPOFOL 10 MG/ML IV BOLUS
INTRAVENOUS | Status: DC | PRN
  Administered 2024-06-23 (×2): 50 mg via INTRAVENOUS

## 2024-06-23 NOTE — Op Note (Signed)
 [facility] Gastroenterology Patient Name: Sherry Sanders Procedure Date: 06/23/2024 10:59 AM MRN: [id number] Account #: [id number] Date of Birth: 1980-11-18 Admit Type: Outpatient Age: 43 Room: [facility] ENDO ROOM 2 Gender: Female Note Status: Finalized Instrument Name: Colon Scope [id number] Procedure:             Colonoscopy Indications:           Screening for colorectal malignant neoplasm Providers:             [name] DO, DO Referring MD:          Fredy CANDIE Bathe, MD (Referring MD) Medicines:             Monitored Anesthesia Care Complications:         No immediate complications. Estimated blood loss:                         Minimal. Procedure:             Pre-Anesthesia Assessment:                        - Prior to the procedure, a History and Physical was                         performed, and patient medications and allergies were                         reviewed. The patient is competent. The risks and                         benefits of the procedure and the sedation options and                         risks were discussed with the patient. All questions                         were answered and informed consent was obtained.                         Patient identification and proposed procedure were                         verified by the physician, the nurse, the anesthetist                         and the technician in the endoscopy suite. Mental                         Status Examination: alert and oriented. Airway                         Examination: normal oropharyngeal airway and neck                         mobility. Respiratory Examination: clear to                         auscultation. CV Examination: RRR, no murmurs, no S3  or S4. Prophylactic Antibiotics: The patient does not                         require prophylactic antibiotics. Prior                         Anticoagulants: The patient has taken no anticoagulant                          or antiplatelet agents. ASA Grade Assessment: II - A                         patient with mild systemic disease. After reviewing                         the risks and benefits, the patient was deemed in                         satisfactory condition to undergo the procedure. The                         anesthesia plan was to use monitored anesthesia care                         (MAC). Immediately prior to administration of                         medications, the patient was re-assessed for adequacy                         to receive sedatives. The heart rate, respiratory                         rate, oxygen saturations, blood pressure, adequacy of                         pulmonary ventilation, and response to care were                         monitored throughout the procedure. The physical                         status of the patient was re-assessed after the                         procedure.                        After obtaining informed consent, the colonoscope was                         passed under direct vision. Throughout the procedure,                         the patient's blood pressure, pulse, and oxygen                         saturations were monitored continuously. The  Colonoscope was introduced through the anus and                         advanced to the the terminal ileum, with                         identification of the appendiceal orifice and IC                         valve. The colonoscopy was performed without                         difficulty. The patient tolerated the procedure well.                         The quality of the bowel preparation was evaluated                         using the BBPS [facility] Bowel Preparation Scale) with                         scores of: Right Colon = 3, Transverse Colon = 3 and                         Left Colon = 3 (entire mucosa seen well with no                         residual  staining, small fragments of stool or opaque                         liquid). The total BBPS score equals 9. The terminal                         ileum, ileocecal valve, appendiceal orifice, and                         rectum were photographed. Findings:      The perianal and digital rectal examinations were normal. Pertinent       negatives include normal sphincter tone.      The terminal ileum appeared normal. Estimated blood loss: none.      Retroflexion in the right colon was performed.      A 1 to 2 mm polyp was found in the rectum. The polyp was sessile. The       polyp was removed with a jumbo cold forceps. Resection and retrieval       were complete. Estimated blood loss was minimal.      The exam was otherwise without abnormality on direct and retroflexion       views. Impression:            - The examined portion of the ileum was normal.                        - One 1 to 2 mm polyp in the rectum, removed with a                         jumbo cold forceps. Resected and retrieved.                        -  The examination was otherwise normal on direct and                         retroflexion views. Recommendation:        - Patient has a contact number available for                         emergencies. The signs and symptoms of potential                         delayed complications were discussed with the patient.                         Return to normal activities tomorrow. Written                         discharge instructions were provided to the patient.                        - Discharge patient to home.                        - Resume previous diet.                        - Continue present medications.                        - Await pathology results.                        - Repeat colonoscopy for surveillance based on                         pathology results.                        - Return to referring physician as previously                         scheduled.                         - The findings and recommendations were discussed with                         the patient. Procedure Code(s):     --- Professional ---                        [phone], Colonoscopy, flexible; with biopsy, single or                         multiple Diagnosis Code(s):     --- Professional ---                        Z12.11, Encounter for screening for malignant neoplasm                         of colon  D12.8, Benign neoplasm of rectum CPT copyright 2022 American Medical Association. All rights reserved. The codes documented in this report are preliminary and upon coder review may  be revised to meet current compliance requirements. Attending Participation:      I personally performed the entire procedure. Elspeth Jungling, DO [name] DO, DO 06/23/2024 11:37:09 AM This report has been signed electronically. Number of Addenda: 0 Note Initiated On: 06/23/2024 10:59 AM Scope Withdrawal Time: 0 hours 11 minutes 31 seconds  Total Procedure Duration: 0 hours 19 minutes 8 seconds  Estimated Blood Loss:  Estimated blood loss was minimal.      [facility]

## 2024-06-23 NOTE — Anesthesia Postprocedure Evaluation (Signed)
 Anesthesia Post Note  Patient: Sherry Sanders  Procedure(s) Performed: COLONOSCOPY  Patient location during evaluation: PACU Anesthesia Type: General Level of consciousness: awake and alert Pain management: satisfactory to patient Vital Signs Assessment: post-procedure vital signs reviewed and stable Respiratory status: spontaneous breathing Cardiovascular status: stable Anesthetic complications: no   No notable events documented.   Last Vitals:  Vitals:   06/23/24 1155 06/23/24 1158  BP: 98/69   Pulse: (!) 51 (!) 52  Resp: 12 14  Temp: (!) 35.7 C (!) 35.8 C  SpO2: 100% 100%    Last Pain:  Vitals:   06/23/24 1158  TempSrc: Temporal  PainSc: 0-No pain                 VAN STAVEREN,Shanyce Daris

## 2024-06-23 NOTE — H&P (Signed)
 Pre-Procedure H&P   Patient ID: Sherry Sanders is a 43 y.o. female.  Gastroenterology Provider: [name], DO  Referring Provider: Dr. Fernand PCP: Orlean Alan HERO, FNP  Date: 06/23/2024  HPI Sherry Sanders is a 43 y.o. female who presents today for Colonoscopy for colorectal cancer screening.  Initial colonoscopy.  No family history of colon cancer or colon polyps.  Denies any GI symptoms   Past Medical History:  Diagnosis Date   Fibroid     Past Surgical History:  Procedure Laterality Date   CESAREAN SECTION  2007    Family History No h/o GI disease or malignancy  Review of Systems  Constitutional:  Negative for activity change, appetite change, chills, diaphoresis, fatigue, fever and unexpected weight change.  HENT:  Negative for trouble swallowing and voice change.   Respiratory:  Negative for shortness of breath and wheezing.   Cardiovascular:  Negative for chest pain, palpitations and leg swelling.  Gastrointestinal:  Negative for abdominal distention, abdominal pain, anal bleeding, blood in stool, constipation, diarrhea, nausea, rectal pain and vomiting.  Musculoskeletal:  Negative for arthralgias and myalgias.  Skin:  Negative for color change and pallor.  Neurological:  Negative for dizziness, syncope and weakness.  Psychiatric/Behavioral:  Negative for confusion.   All other systems reviewed and are negative.    Medications Medications Ordered Prior to Encounter[1]  Pertinent medications related to GI and procedure were reviewed by me with the patient prior to the procedure  Current Medications[2]  sodium chloride  20 mL/hr (06/23/24 1038)       Allergies[3] Allergies were reviewed by me prior to the procedure  Objective   Body mass index is 32.54 kg/m. Vitals:   06/23/24 1028  BP: 114/73  Pulse: 68  Resp: 20  Temp: (!) 96.3 F (35.7 C)  TempSrc: Temporal  SpO2: 100%  Weight: 71.8 kg  Height: 4' 10.5 (1.486 m)      Physical Exam Vitals and nursing note reviewed.  Constitutional:      General: She is not in acute distress.    Appearance: Normal appearance. She is not ill-appearing, toxic-appearing or diaphoretic.  HENT:     Head: Normocephalic and atraumatic.     Nose: Nose normal.     Mouth/Throat:     Mouth: Mucous membranes are moist.     Pharynx: Oropharynx is clear.  Eyes:     General: No scleral icterus.    Extraocular Movements: Extraocular movements intact.  Cardiovascular:     Rate and Rhythm: Normal rate.     Heart sounds: Normal heart sounds. No murmur heard.    No friction rub. No gallop.  Pulmonary:     Effort: Pulmonary effort is normal. No respiratory distress.     Breath sounds: Normal breath sounds. No wheezing, rhonchi or rales.  Abdominal:     General: Bowel sounds are normal. There is no distension.     Palpations: Abdomen is soft.     Tenderness: There is no abdominal tenderness. There is no guarding or rebound.  Musculoskeletal:     Cervical back: Neck supple.     Right lower leg: No edema.     Left lower leg: No edema.  Skin:    General: Skin is warm and dry.     Coloration: Skin is not jaundiced or pale.  Neurological:     General: No focal deficit present.     Mental Status: She is alert and oriented to person, place, and time.  Mental status is at baseline.  Psychiatric:        Mood and Affect: Mood normal.        Behavior: Behavior normal.        Thought Content: Thought content normal.        Judgment: Judgment normal.      Assessment:  Sherry Sanders is a 43 y.o. female  who presents today for Colonoscopy for colorectal cancer screening.  Plan:  Colonoscopy with possible intervention today  Colonoscopy  with possible biopsy, control of bleeding, polypectomy, and interventions as necessary has been discussed with the patient/patient representative. Informed consent was obtained from the patient/patient representative after explaining  the indication, nature, and risks of the procedure including but not limited to death, bleeding, perforation, missed neoplasm/lesions, cardiorespiratory compromise, and reaction to medications. Opportunity for questions was given and appropriate answers were provided. Patient/patient representative has verbalized understanding is amenable to undergoing the procedure.   [name], DO  [facility]  Portions of the record may have been created with voice recognition software. Occasional wrong-word or 'sound-a-like' substitutions may have occurred due to the inherent limitations of voice recognition software.  Read the chart carefully and recognize, using context, where substitutions may have occurred.    [1]  No current facility-administered medications on file prior to encounter.   Current Outpatient Medications on File Prior to Encounter  Medication Sig Dispense Refill   Multiple Vitamins-Minerals (WOMENS MULTI VITAMIN & MINERAL PO) Take 2 capsules by mouth daily.     norethindrone (MICRONOR) 0.35 MG tablet TAKE 1 TABLET BY MOUTH EVERY DAY 84 tablet 3   phentermine  (ADIPEX-P ) 37.5 MG tablet Take 1 tablet (37.5 mg total) by mouth daily before breakfast. 30 tablet 2   rosuvastatin (CRESTOR) 5 MG tablet TAKE 1 TABLET BY MOUTH EVERY DAY 90 tablet 3  [2]  Current Facility-Administered Medications:    0.9 %  sodium chloride  infusion, , Intravenous, Continuous, Jungling Elspeth Ozell, DO, Last Rate: 20 mL/hr at 06/23/24 1038, 20 mL/hr at 06/23/24 1038 [3] No Known Allergies

## 2024-06-23 NOTE — Anesthesia Preprocedure Evaluation (Signed)
 Anesthesia Evaluation  Patient identified by MRN, date of birth, ID band Patient awake    Reviewed: Allergy & Precautions, NPO status , Patient's Chart, lab work & pertinent test results  Airway Mallampati: II  TM Distance: >3 FB Neck ROM: Full    Dental  (+) Teeth Intact   Pulmonary neg pulmonary ROS   Pulmonary exam normal        Cardiovascular Exercise Tolerance: Good hypertension, negative cardio ROS Normal cardiovascular exam Rhythm:Regular Rate:Normal     Neuro/Psych negative neurological ROS  negative psych ROS   GI/Hepatic negative GI ROS, Neg liver ROS,,,  Endo/Other  negative endocrine ROS  Class 3 obesity  Renal/GU negative Renal ROS  negative genitourinary   Musculoskeletal   Abdominal   Peds negative pediatric ROS (+)  Hematology negative hematology ROS (+)   Anesthesia Other Findings Past Medical History: No date: Fibroid  Past Surgical History: 2007: CESAREAN SECTION  BMI    Body Mass Index: 32.54 kg/m      Reproductive/Obstetrics negative OB ROS                              Anesthesia Physical Anesthesia Plan  ASA: 2  Anesthesia Plan: General   Post-op Pain Management:    Induction: Intravenous  PONV Risk Score and Plan: Propofol  infusion and TIVA  Airway Management Planned: Natural Airway and Nasal Cannula  Additional Equipment:   Intra-op Plan:   Post-operative Plan:   Informed Consent: I have reviewed the patients History and Physical, chart, labs and discussed the procedure including the risks, benefits and alternatives for the proposed anesthesia with the patient or authorized representative who has indicated his/her understanding and acceptance.     Dental Advisory Given  Plan Discussed with: CRNA  Anesthesia Plan Comments:         Anesthesia Quick Evaluation

## 2024-06-23 NOTE — Interval H&P Note (Signed)
 History and Physical Interval Note: Preprocedure H&P from 06/23/2024  was reviewed and there was no interval change after seeing and examining the patient.  Written consent was obtained from the patient after discussion of risks, benefits, and alternatives. Patient has consented to proceed with Colonoscopy with possible intervention   06/23/2024 11:04 AM  Sherry Sanders  has presented today for surgery, with the diagnosis of Screening for colon cancer.  The various methods of treatment have been discussed with the patient and family. After consideration of risks, benefits and other options for treatment, the patient has consented to  Procedures: COLONOSCOPY (N/A) as a surgical intervention.  The patient's history has been reviewed, patient examined, no change in status, stable for surgery.  I have reviewed the patient's chart and labs.  Questions were answered to the patient's satisfaction.     [name]

## 2024-06-23 NOTE — Transfer of Care (Signed)
 Immediate Anesthesia Transfer of Care Note  Patient: Sherry Sanders  Procedure(s) Performed: COLONOSCOPY  Patient Location: PACU  Anesthesia Type:General  Level of Consciousness: sedated  Airway & Oxygen Therapy: Patient Spontanous Breathing  Post-op Assessment: Report given to RN and Post -op Vital signs reviewed and stable  Post vital signs: Reviewed and stable  Last Vitals:  Vitals Value Taken Time  BP 94/61 06/23/24 11:35  Temp    Pulse 73 06/23/24 11:35  Resp 17 06/23/24 11:35  SpO2 100 % 06/23/24 11:35  Vitals shown include unfiled device data.  Last Pain:  Vitals:   06/23/24 1135  TempSrc: Temporal  PainSc: Asleep         Complications: No notable events documented.

## 2024-06-24 ENCOUNTER — Encounter: Payer: Self-pay | Admitting: Gastroenterology

## 2024-06-24 LAB — SURGICAL PATHOLOGY

## 2024-06-26 ENCOUNTER — Inpatient Hospital Stay

## 2024-06-26 ENCOUNTER — Encounter: Payer: Self-pay | Admitting: Licensed Clinical Social Worker

## 2024-06-26 ENCOUNTER — Inpatient Hospital Stay: Admitting: Licensed Clinical Social Worker

## 2024-06-26 ENCOUNTER — Other Ambulatory Visit: Payer: Self-pay | Admitting: Licensed Clinical Social Worker

## 2024-06-26 DIAGNOSIS — Z8481 Family history of carrier of genetic disease: Secondary | ICD-10-CM | POA: Diagnosis not present

## 2024-06-26 DIAGNOSIS — Z8041 Family history of malignant neoplasm of ovary: Secondary | ICD-10-CM

## 2024-06-26 DIAGNOSIS — Z803 Family history of malignant neoplasm of breast: Secondary | ICD-10-CM | POA: Diagnosis not present

## 2024-06-26 DIAGNOSIS — Z1379 Encounter for other screening for genetic and chromosomal anomalies: Secondary | ICD-10-CM

## 2024-06-26 DIAGNOSIS — Z800 Family history of malignant neoplasm of digestive organs: Secondary | ICD-10-CM | POA: Diagnosis not present

## 2024-06-26 LAB — GENETIC SCREENING ORDER

## 2024-06-26 NOTE — Progress Notes (Signed)
 REFERRING PROVIDER: Self-referred  PRIMARY PROVIDER:  Orlean Alan HERO, FNP  PRIMARY REASON FOR VISIT:  1. Family history of gene mutation   2. Family history of breast cancer   3. Family history of ovarian cancer   4. Family history of colon cancer      HISTORY OF PRESENT ILLNESS:   Sherry Sanders, a 43 y.o. female, was seen for a [facility] cancer genetics consultation due to her mother's recent genetic testing that showed a pathogenic mutation in RAD51C called p.S105* (c.314C>G).   Sherry Sanders presents to clinic today to discuss the possibility of a hereditary predisposition to cancer, genetic testing, and to further clarify her future cancer risks, as well as potential cancer risks for family members.   CANCER HISTORY:   Sherry Sanders is a 43 y.o. female with no personal history of cancer.     RELEVANT MEDICAL HISTORY: .  Ovaries intact: yes.  Hysterectomy: no.  Menopausal status: premenopausal.   Past Medical History:  Diagnosis Date   Fibroid     Past Surgical History:  Procedure Laterality Date   CESAREAN SECTION  2007   COLONOSCOPY N/A 06/23/2024   Procedure: COLONOSCOPY;  Surgeon: Onita Elspeth Sharper, DO;  Location: [facility] ENDOSCOPY;  Service: Gastroenterology;  Laterality: N/A;   POLYPECTOMY  06/23/2024   Procedure: POLYPECTOMY, INTESTINE;  Surgeon: Onita Elspeth Sharper, DO;  Location: [facility] ENDOSCOPY;  Service: Gastroenterology;;    FAMILY HISTORY:  We obtained a detailed, 4-generation family history.  Significant diagnoses are listed below: Family History  Problem Relation Age of Onset   Breast cancer Mother 51       RAD51C+   Lung cancer Maternal Aunt    Colon cancer Maternal Uncle    Ovarian cancer Paternal Grandmother    Sherry Sanders has 2 brothers and 1 daughter, no cancers.   Sherry Sanders mother was diagnosed with breast cancer at 28, she underwent genetic testing that showed a pathogenic mutation in RAD51C called p.S105* (c.314C>G). Patient's maternal aunt has  lung cancer. Maternal uncle had colon cancer.  Sherry Sanders father is living at 48. Paternal grandmother died of ovarian cancer in her 67s. Paternal great aunt died of breast cancer, and two other great aunts died of cancer, unknown types.   Sherry Sanders is aware of previous family history of genetic testing for hereditary cancer risks. There is no reported Ashkenazi Jewish ancestry. There is no known consanguinity.    GENETIC COUNSELING ASSESSMENT: Sherry Sanders is a 43 y.o. female with a family history of a RAD51C pathogenic mutation in her mother.  We, therefore, discussed and recommended the following at today's visit.   DISCUSSION: We discussed that approximately 10% of cancer is hereditary. We discussed the RAD51C gene in detail, noting increased risk for breast and ovarian cancer associated and potential management changes. She has a 50% chance to have inherited this from her mother. There are other genes associated with hereditary cancer as well. Cancers and risks are gene specific. We discussed that testing is beneficial for several reasons including knowing about cancer risks, identifying potential screening and risk-reduction options that may be appropriate, and to understand if other family members could be at risk for cancer and allow them to undergo genetic testing.   We reviewed the characteristics, features and inheritance patterns of hereditary cancer syndromes. We also discussed genetic testing, including the appropriate family members to test, the process of testing, insurance coverage and turn-around-time for results. We discussed the implications of a negative, positive and/or  variant of uncertain significant result. Since she has paternal family history of ovarian/breast cancer, we recommended Sherry Sanders pursue genetic testing for the [facility] CancerNext+RNA gene panel.   The [facility] CancerNext+RNAinsight Panel includes sequencing, rearrangement analysis, and RNA analysis for the following 40  genes: APC, ATM, BAP1, BARD1, BMPR1A, BRCA1, BRCA2, BRIP1, CDH1, CDKN2A, CHEK2, FH, FLCN, MET, MLH1, MSH2, MSH6, MUTYH, NF1, NTHL1, PALB2, PMS2, PTEN, RAD51C, RAD51D, RPS20, SMAD4, STK11, TP53, TSC1, TSC2, and VHL (sequencing and deletion/duplication); AXIN2, HOXB13, MBD4, MSH3, POLD1 and POLE (sequencing only); EPCAM and GREM1 (deletion/duplication only).  Based on Sherry Sanders's family history of cancer, she meets medical criteria for genetic testing. Despite that she meets criteria, she may still have an out of pocket cost.   PLAN: After considering the risks, benefits, and limitations, Sherry Sanders provided informed consent to pursue genetic testing and the blood sample was sent to [facility] for analysis of the CancerNext+RNA Panel. Results should be available within approximately 2-3 weeks' time, at which point they will be disclosed by telephone to Sherry Sanders, as will any additional recommendations warranted by these results. Sherry Sanders will receive a summary of her genetic counseling visit and a copy of her results once available. This information will also be available in Epic.   Sherry Sanders questions were answered to her satisfaction today. Our contact information was provided should additional questions or concerns arise. Thank you for the referral and allowing us  to share in the care of your patient.   Dena Cary, MS, [facility] Genetic Counselor [email or web address] Phone: [phone]  I personally spent a total of 35 minutes in the care of the patient today including getting/reviewing separately obtained history, counseling and educating, placing orders, and documenting clinical information in the EHR.  Dr. Delinda was available for discussion regarding this case.   _______________________________________________________________________ For Office Staff:  Number of people involved in session: 2 Was an Intern/ student involved with case: no

## 2024-06-30 DIAGNOSIS — J029 Acute pharyngitis, unspecified: Secondary | ICD-10-CM | POA: Diagnosis not present

## 2024-06-30 DIAGNOSIS — Z03818 Encounter for observation for suspected exposure to other biological agents ruled out: Secondary | ICD-10-CM | POA: Diagnosis not present

## 2024-06-30 DIAGNOSIS — J019 Acute sinusitis, unspecified: Secondary | ICD-10-CM | POA: Diagnosis not present

## 2024-06-30 DIAGNOSIS — R051 Acute cough: Secondary | ICD-10-CM | POA: Diagnosis not present

## 2024-07-02 DIAGNOSIS — R519 Headache, unspecified: Secondary | ICD-10-CM | POA: Diagnosis not present

## 2024-07-02 DIAGNOSIS — M9901 Segmental and somatic dysfunction of cervical region: Secondary | ICD-10-CM | POA: Diagnosis not present

## 2024-07-02 DIAGNOSIS — M9902 Segmental and somatic dysfunction of thoracic region: Secondary | ICD-10-CM | POA: Diagnosis not present

## 2024-07-02 DIAGNOSIS — M5412 Radiculopathy, cervical region: Secondary | ICD-10-CM | POA: Diagnosis not present

## 2024-07-15 ENCOUNTER — Telehealth: Payer: Self-pay | Admitting: Licensed Clinical Social Worker

## 2024-07-15 ENCOUNTER — Encounter: Payer: Self-pay | Admitting: Licensed Clinical Social Worker

## 2024-07-15 ENCOUNTER — Ambulatory Visit: Payer: Self-pay | Admitting: Licensed Clinical Social Worker

## 2024-07-15 DIAGNOSIS — Z1379 Encounter for other screening for genetic and chromosomal anomalies: Secondary | ICD-10-CM | POA: Insufficient documentation

## 2024-07-15 NOTE — Telephone Encounter (Signed)
 I contacted Ms. Foss to discuss her genetic testing results. No pathogenic variants were identified in the 40 genes analyzed. Detailed clinic note to follow.   The test report has been scanned into EPIC and is located under the Molecular Pathology section of the Results Review tab.  A portion of the result report is included below for reference.       Valri Gee, MS, [facility] Genetic Counselor [email or web address] Phone: [phone]

## 2024-07-15 NOTE — Progress Notes (Signed)
 HPI:   Sherry Sanders was previously seen in the [facility] clinic due to a family history of cancer, her mother's recent genetic testing that showed a RAD51C mutation,  and concerns regarding a hereditary predisposition to cancer. Please refer to our prior cancer genetics clinic note for more information regarding our discussion, assessment and recommendations, at the time. Sherry Sanders recent genetic test results were disclosed to her, as were recommendations warranted by these results. These results and recommendations are discussed in more detail below.  CANCER HISTORY:  Oncology History   No problem history exists.    FAMILY HISTORY:  We obtained a detailed, 4-generation family history.  Significant diagnoses are listed below: Family History  Problem Relation Age of Onset   Breast cancer Mother 78       RAD51C+   Lung cancer Maternal Aunt    Colon cancer Maternal Uncle    Ovarian cancer Paternal Grandmother    Sherry Sanders has 2 brothers and 1 daughter, no cancers.    Sherry Sanders mother was diagnosed with breast cancer at 75, she underwent genetic testing that showed a pathogenic mutation in RAD51C called p.S105* (c.314C>G). Patient's maternal aunt has lung cancer. Maternal uncle had colon cancer.   Sherry Sanders father is living at 45. Paternal grandmother died of ovarian cancer in her 28s. Paternal great aunt died of breast cancer, and two other great aunts died of cancer, unknown types.    Sherry Sanders is aware of previous family history of genetic testing for hereditary cancer risks. There is no reported Ashkenazi Jewish ancestry. There is no known consanguinity.      GENETIC TEST RESULTS:  The [facility] CancerNext+RNA Panel found no pathogenic mutations.   The [facility] CancerNext+RNAinsight Panel includes sequencing, rearrangement analysis, and RNA analysis for the following 40 genes: APC, ATM, BAP1, BARD1, BMPR1A, BRCA1, BRCA2, BRIP1, CDH1, CDKN2A, CHEK2, FH, FLCN, MET, MLH1,  MSH2, MSH6, MUTYH, NF1, NTHL1, PALB2, PMS2, PTEN, RAD51C, RAD51D, RPS20, SMAD4, STK11, TP53, TSC1, TSC2, and VHL (sequencing and deletion/duplication); AXIN2, HOXB13, MBD4, MSH3, POLD1 and POLE (sequencing only); EPCAM and GREM1 (deletion/duplication only).   The test report has been scanned into EPIC and is located under the Molecular Pathology section of the Results Review tab.  A portion of the result report is included below for reference. Genetic testing reported out on 07/15/2024.       We recommended Sherry Sanders pursue testing for the familial hereditary cancer gene mutation, RAD51C p.S105*. Sherry Sanders test was normal and did not reveal the familial mutation. We call this result a true negative result because the cancer-causing mutation was identified in Sherry Sanders's family, and she did not inherit it.  Given this negative result, Sherry Sanders's chances of developing RAD51C-related cancers are the same as they are in the general population.    Even though a pathogenic variant was not identified, possible explanations for the paternal family history of cancer may include: There may be no hereditary risk for cancer in the family. The cancers her family may be sporadic/familial or due to other genetic and environmental factors. There may be a gene mutation in one of these genes that current testing methods cannot detect but that chance is small. There could be another gene that has not yet been discovered, or that we have not yet tested, that is responsible for the cancer diagnoses in the family.  It is also possible there is a hereditary cause for the cancer in the family that Sherry Sanders  did not inherit. Therefore, it is important to remain in touch with cancer genetics in the future so that we can continue to offer Sherry Sanders the most up to date genetic testing.   ADDITIONAL GENETIC TESTING:  We discussed with Sherry Sanders that her genetic testing was fairly extensive.  If there are additional relevant  genes identified to increase cancer risk that can be analyzed in the future, we would be happy to discuss and coordinate this testing at that time.    CANCER SCREENING RECOMMENDATIONS:  Sherry Sanders test result is considered negative (normal).    An individual's cancer risk and medical management are not determined by genetic test results alone. Overall cancer risk assessment incorporates additional factors, including personal medical history, family history, and any available genetic information that may result in a personalized plan for cancer prevention and surveillance. Therefore, it is recommended she continue to follow the cancer management and screening guidelines provided by her primary healthcare provider.  RECOMMENDATIONS FOR FAMILY MEMBERS:   Since she did not inherit a identifiable mutation in a cancer predisposition gene included on this panel, her children could not have inherited a known mutation from her in one of these genes. Individuals in this family might be at some increased risk of developing cancer, over the general population risk, due to the family history of cancer.  Individuals in the family should notify their providers of the family history of cancer. We recommend women in this family have a yearly mammogram beginning at age 78, or 28 years younger than the earliest onset of cancer, an annual clinical breast exam, and perform monthly breast self-exams.  Family members should have colonoscopies by at age 71, or earlier, as recommended by their providers. Other members of the family may still carry a pathogenic variant in one of these genes that Sherry Sanders did not inherit. Based on the family history, we recommend her maternal relatives have genetic testing for the RAD51C mutation, and paternal relatives can consider testing given family history of ovarian cancer.  Sherry Sanders will let us  know if we can be of any assistance in coordinating genetic counseling and/or testing for this  family member.   FOLLOW-UP:  Lastly, we discussed with Sherry Sanders that cancer genetics is a rapidly advancing field and it is possible that new genetic tests will be appropriate for her and/or her family members in the future. We encouraged her to remain in contact with cancer genetics on an annual basis so we can update her personal and family histories and let her know of advances in cancer genetics that may benefit this family.   Our contact number was provided. Sherry Sanders questions were answered to her satisfaction, and she knows she is welcome to call us  at anytime with additional questions or concerns.    Dena Cary, MS, [facility] Genetic Counselor [email or web address] Phone: [phone]

## 2024-09-16 ENCOUNTER — Ambulatory Visit: Admitting: Family
